# Patient Record
Sex: Female | Born: 1980 | Race: Black or African American | Hispanic: No | Marital: Single | Smoking: Never smoker
Health system: Southern US, Community
[De-identification: ages and names within clinical notes are randomized; demographics above are authoritative.]

## PROBLEM LIST (undated history)

## (undated) DIAGNOSIS — F32A Depression, unspecified: Secondary | ICD-10-CM

## (undated) DIAGNOSIS — F329 Major depressive disorder, single episode, unspecified: Secondary | ICD-10-CM

---
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
## 2000-12-07 ENCOUNTER — Encounter: Payer: Self-pay | Admitting: Emergency Medicine

## 2000-12-07 ENCOUNTER — Emergency Department (HOSPITAL_COMMUNITY): Admission: EM | Admit: 2000-12-07 | Discharge: 2000-12-07 | Payer: Self-pay | Admitting: Emergency Medicine

## 2003-06-12 ENCOUNTER — Emergency Department (HOSPITAL_COMMUNITY): Admission: EM | Admit: 2003-06-12 | Discharge: 2003-06-12 | Payer: Self-pay | Admitting: Emergency Medicine

## 2003-08-01 ENCOUNTER — Emergency Department (HOSPITAL_COMMUNITY): Admission: EM | Admit: 2003-08-01 | Discharge: 2003-08-02 | Payer: Self-pay | Admitting: Emergency Medicine

## 2003-10-05 ENCOUNTER — Emergency Department (HOSPITAL_COMMUNITY): Admission: EM | Admit: 2003-10-05 | Discharge: 2003-10-05 | Payer: Self-pay | Admitting: Emergency Medicine

## 2004-04-12 ENCOUNTER — Emergency Department (HOSPITAL_COMMUNITY): Admission: EM | Admit: 2004-04-12 | Discharge: 2004-04-12 | Payer: Self-pay | Admitting: Emergency Medicine

## 2004-04-13 ENCOUNTER — Emergency Department (HOSPITAL_COMMUNITY): Admission: EM | Admit: 2004-04-13 | Discharge: 2004-04-13 | Payer: Self-pay | Admitting: Emergency Medicine

## 2004-05-14 ENCOUNTER — Emergency Department (HOSPITAL_COMMUNITY): Admission: EM | Admit: 2004-05-14 | Discharge: 2004-05-14 | Payer: Self-pay | Admitting: Emergency Medicine

## 2004-06-16 ENCOUNTER — Ambulatory Visit (HOSPITAL_COMMUNITY): Admission: RE | Admit: 2004-06-16 | Discharge: 2004-06-16 | Payer: Self-pay | Admitting: *Deleted

## 2004-09-14 ENCOUNTER — Ambulatory Visit (HOSPITAL_COMMUNITY): Admission: RE | Admit: 2004-09-14 | Discharge: 2004-09-14 | Payer: Self-pay | Admitting: *Deleted

## 2004-10-08 ENCOUNTER — Inpatient Hospital Stay (HOSPITAL_COMMUNITY): Admission: AD | Admit: 2004-10-08 | Discharge: 2004-10-09 | Payer: Self-pay | Admitting: Obstetrics and Gynecology

## 2004-11-08 ENCOUNTER — Inpatient Hospital Stay (HOSPITAL_COMMUNITY): Admission: AD | Admit: 2004-11-08 | Discharge: 2004-11-08 | Payer: Self-pay | Admitting: Family Medicine

## 2004-12-04 ENCOUNTER — Ambulatory Visit (HOSPITAL_COMMUNITY): Admission: RE | Admit: 2004-12-04 | Discharge: 2004-12-04 | Payer: Self-pay | Admitting: Family Medicine

## 2005-01-06 ENCOUNTER — Ambulatory Visit: Payer: Self-pay | Admitting: Family Medicine

## 2005-01-06 ENCOUNTER — Inpatient Hospital Stay (HOSPITAL_COMMUNITY): Admission: AD | Admit: 2005-01-06 | Discharge: 2005-01-06 | Payer: Self-pay | Admitting: Family Medicine

## 2005-01-06 ENCOUNTER — Inpatient Hospital Stay (HOSPITAL_COMMUNITY): Admission: AD | Admit: 2005-01-06 | Discharge: 2005-01-09 | Payer: Self-pay | Admitting: Family Medicine

## 2006-01-28 ENCOUNTER — Other Ambulatory Visit: Admission: RE | Admit: 2006-01-28 | Discharge: 2006-01-28 | Payer: Self-pay | Admitting: Family Medicine

## 2006-01-28 ENCOUNTER — Ambulatory Visit: Payer: Self-pay | Admitting: Family Medicine

## 2006-02-15 ENCOUNTER — Ambulatory Visit: Payer: Self-pay

## 2006-05-23 ENCOUNTER — Emergency Department (HOSPITAL_COMMUNITY): Admission: EM | Admit: 2006-05-23 | Discharge: 2006-05-23 | Payer: Self-pay | Admitting: Emergency Medicine

## 2007-11-03 ENCOUNTER — Encounter: Payer: Self-pay | Admitting: Family Medicine

## 2007-11-03 ENCOUNTER — Other Ambulatory Visit: Admission: RE | Admit: 2007-11-03 | Discharge: 2007-11-03 | Payer: Self-pay | Admitting: Family Medicine

## 2007-11-03 ENCOUNTER — Encounter: Payer: Self-pay | Admitting: *Deleted

## 2007-11-03 ENCOUNTER — Ambulatory Visit: Payer: Self-pay | Admitting: Family Medicine

## 2007-11-06 ENCOUNTER — Ambulatory Visit: Payer: Self-pay | Admitting: Family Medicine

## 2007-11-06 ENCOUNTER — Encounter: Payer: Self-pay | Admitting: Family Medicine

## 2007-11-06 LAB — CONVERTED CEMR LAB
Chlamydia, DNA Probe: POSITIVE — AB
Cholesterol: 155 mg/dL (ref 0–200)
GC Probe Amp, Genital: NEGATIVE
Triglycerides: 50 mg/dL (ref <150)
VLDL: 10 mg/dL (ref 0–40)

## 2007-11-09 ENCOUNTER — Encounter: Payer: Self-pay | Admitting: Family Medicine

## 2008-03-28 ENCOUNTER — Emergency Department (HOSPITAL_COMMUNITY): Admission: EM | Admit: 2008-03-28 | Discharge: 2008-03-28 | Payer: Self-pay | Admitting: Emergency Medicine

## 2008-07-12 ENCOUNTER — Emergency Department (HOSPITAL_COMMUNITY): Admission: EM | Admit: 2008-07-12 | Discharge: 2008-07-12 | Payer: Self-pay | Admitting: Family Medicine

## 2008-07-17 ENCOUNTER — Emergency Department (HOSPITAL_COMMUNITY): Admission: EM | Admit: 2008-07-17 | Discharge: 2008-07-17 | Payer: Self-pay | Admitting: Family Medicine

## 2008-08-15 ENCOUNTER — Emergency Department (HOSPITAL_COMMUNITY): Admission: EM | Admit: 2008-08-15 | Discharge: 2008-08-15 | Payer: Self-pay | Admitting: Emergency Medicine

## 2008-11-29 ENCOUNTER — Emergency Department (HOSPITAL_COMMUNITY): Admission: EM | Admit: 2008-11-29 | Discharge: 2008-11-29 | Payer: Self-pay | Admitting: Family Medicine

## 2008-12-10 ENCOUNTER — Telehealth: Payer: Self-pay | Admitting: Family Medicine

## 2008-12-10 ENCOUNTER — Encounter: Payer: Self-pay | Admitting: Family Medicine

## 2009-04-08 ENCOUNTER — Ambulatory Visit: Payer: Self-pay | Admitting: Family Medicine

## 2009-04-08 ENCOUNTER — Encounter: Payer: Self-pay | Admitting: Family Medicine

## 2009-04-08 DIAGNOSIS — K5900 Constipation, unspecified: Secondary | ICD-10-CM | POA: Insufficient documentation

## 2009-07-01 ENCOUNTER — Ambulatory Visit: Payer: Self-pay | Admitting: Family Medicine

## 2009-09-01 ENCOUNTER — Ambulatory Visit: Payer: Self-pay | Admitting: Family Medicine

## 2009-09-22 ENCOUNTER — Emergency Department (HOSPITAL_COMMUNITY): Admission: EM | Admit: 2009-09-22 | Discharge: 2009-09-22 | Payer: Self-pay | Admitting: Emergency Medicine

## 2009-10-06 ENCOUNTER — Encounter: Payer: Self-pay | Admitting: Family Medicine

## 2009-10-06 ENCOUNTER — Ambulatory Visit: Payer: Self-pay | Admitting: Family Medicine

## 2009-10-06 DIAGNOSIS — R109 Unspecified abdominal pain: Secondary | ICD-10-CM

## 2009-10-06 LAB — CONVERTED CEMR LAB
Chlamydia, DNA Probe: NEGATIVE
GC Probe Amp, Genital: NEGATIVE

## 2010-01-29 ENCOUNTER — Telehealth: Payer: Self-pay | Admitting: Family Medicine

## 2010-02-03 ENCOUNTER — Emergency Department (HOSPITAL_COMMUNITY): Admission: EM | Admit: 2010-02-03 | Discharge: 2010-02-03 | Payer: Self-pay | Admitting: Family Medicine

## 2010-02-09 ENCOUNTER — Ambulatory Visit: Payer: Self-pay | Admitting: Family Medicine

## 2010-02-09 ENCOUNTER — Encounter: Payer: Self-pay | Admitting: Family Medicine

## 2010-02-09 DIAGNOSIS — R109 Unspecified abdominal pain: Secondary | ICD-10-CM | POA: Insufficient documentation

## 2010-02-09 DIAGNOSIS — K219 Gastro-esophageal reflux disease without esophagitis: Secondary | ICD-10-CM

## 2010-02-09 DIAGNOSIS — N63 Unspecified lump in unspecified breast: Secondary | ICD-10-CM

## 2010-02-09 LAB — CONVERTED CEMR LAB
MCHC: 34.3 g/dL (ref 30.0–36.0)
RBC: 4.28 M/uL (ref 3.87–5.11)
WBC: 4.6 10*3/uL (ref 4.0–10.5)

## 2010-02-16 ENCOUNTER — Encounter: Admission: RE | Admit: 2010-02-16 | Discharge: 2010-02-16 | Payer: Self-pay | Admitting: Family Medicine

## 2010-05-26 NOTE — Assessment & Plan Note (Signed)
Summary: tooth pain/[place]/Jillian Hernandez   Vital Signs:  Patient profile:   30 year old female Weight:      172 pounds Pulse rate:   84 / minute BP sitting:   124 / 78  (right arm)  Vitals Entered By: Arlyss Repress CMA, (July 01, 2009 4:51 PM) CC: tooth ache. filling came out today. Is Patient Diabetic? No Pain Assessment Patient in pain? yes     Location: tooth Intensity: 8 Onset of pain  x 1d   CC:  tooth ache. filling came out today.Marland Kitchen  History of Present Illness: [facility] to [facility] clinic and was sent here for toothache.  Eating a gummy bear today and pulled filling out, has apt with dentist in 2 weeks.  No problems with teeth before today.  Habits & Providers  Alcohol-Tobacco-Diet     Tobacco Status: quit  Social History: Smoking Status:  quit  Review of Systems      See HPI General:  Denies fever.  Physical Exam  General:  Well-developed,well-nourished,in no acute distress; alert,appropriate and cooperative throughout examination Mouth:  small filling out of left lower molar, tender to tapping   Impression & Recommendations:  Problem # 1:  UNSPECIFIED DISORDER TEETH&SUPPORTING STRUCTURES (ICD-525.9) AMoxicillin if she would develop increased pain, ibuprofen for pain, dentist, may use dental wax to block hole Orders: [facility]- Est Level  3 (99213)  Complete Medication List: 1)  Amoxicillin 500 Mg Caps (Amoxicillin) .... One three times a day for 7 days 2)  Ibuprofen 800 Mg Tabs (Ibuprofen) .... One three times a day as needed pain  Patient Instructions: 1)  dental wax 2)  Dentist apt Prescriptions: IBUPROFEN 800 MG TABS (IBUPROFEN) one three times a day as needed pain Brand medically necessary #90 x 1   Entered and Authorized by:   Luretha Murphy NP   Signed by:   Luretha Murphy NP on 07/01/2009   Method used:   Print then Give to Patient   RxID:   [id number] IBUPROFEN 800 MG TABS (IBUPROFEN) one three times a day as needed pain Brand medically necessary #90 x 1  Entered and Authorized by:   Luretha Murphy NP   Signed by:   Luretha Murphy NP on 07/01/2009   Method used:   Print then Give to Patient   RxID:   [id number] METRONIDAZOLE 500 MG TABS (METRONIDAZOLE) two times a day for 7 days  #14 x 0   Entered and Authorized by:   Luretha Murphy NP   Signed by:   Luretha Murphy NP on 07/01/2009   Method used:   Print then Give to Patient   RxID:   [id number] METROGEL-VAGINAL 0.75 %  GEL (METRONIDAZOLE) use qh x 3 for symptoms, 1 unit  #1 x 3   Entered and Authorized by:   Luretha Murphy NP   Signed by:   Luretha Murphy NP on 07/01/2009   Method used:   Print then Give to Patient   RxID:   [id number] AMOXICILLIN 500 MG CAPS (AMOXICILLIN) one three times a day for 7 days  #21 x 0   Entered and Authorized by:   Luretha Murphy NP   Signed by:   Luretha Murphy NP on 07/01/2009   Method used:   Print then Give to Patient   RxID:   [id number]

## 2010-05-26 NOTE — Assessment & Plan Note (Signed)
Summary: stomach pain,df   Vital Signs:  Patient profile:   30 year old female Height:      63.75 inches Weight:      170.5 pounds BMI:     29.60 Temp:     98.3 degrees F oral Pulse rate:   80 / minute BP sitting:   102 / 65  (left arm) Cuff size:   regular  Vitals Entered By: Gladstone Pih (October 06, 2009 3:19 PM) CC: C/O stomach pain in lower abd X2weeks Is Patient Diabetic? No Pain Assessment Patient in pain? yes     Location: abdomen Onset of pain  X 2 weeks   Primary Care Provider:  Luretha Murphy NP  CC:  C/O stomach pain in lower abd X2weeks.  History of Present Illness: seen for abdominal pain 2 weeks ago at urgent care (5/30). had u/a and upreg done. given rx for bentyl and prilosec. patient states it is similar to prior episodes of BV. has mirena IUD. no concern for STIs. no N/V, diarrhea, constipation, dysuria, fever, chills. pain not related to anything such as meals. LMP just stopped a few days ago.  Habits & Providers  Alcohol-Tobacco-Diet     Tobacco Status: never  Current Medications (verified): 1)  None  Allergies (verified): No Known Drug Allergies  Physical Exam  General:  well appearing female, NAD. vitals reviewed.  Mouth:  MMM Abdomen:  Bowel sounds positive,abdomen soft and non-tender without masses, organomegaly or hernias noted. Genitalia:  Normal introitus for age, no external lesions, no vaginal discharge, mucosa pink and moist, no vaginal or cervical lesions, no vaginal atrophy, no friaility or hemorrhage, normal uterus size and position, no adnexal masses or tenderness. IUD strings visualized   Impression & Recommendations:  Problem # 1:  PELVIC  PAIN (ICD-789.09) Assessment New wet prep c/w BV. rx for flagyl and metrogel.   The following medications were removed from the medication list:    Ibuprofen 800 Mg Tabs (Ibuprofen) ..... One three times a day as needed pain  Orders: GC/Chlamydia-[facility] (87591/87491) Wet Prep- [facility]  (16109) [facility]- Est Level  3 (60454) Prescriptions: METROGEL-VAGINAL 0.75 % GEL (METRONIDAZOLE) one applicator-full at bedtime x7 days. disp 1 week supply.  #1 x 3   Entered and Authorized by:   Lequita Asal  MD   Signed by:   Lequita Asal  MD on 10/06/2009   Method used:   Electronically to        [facility] (retail)       [address]       [place], [place]  [id number]       Ph: [phone]       Fax: [phone]   RxID:   [id number] METRONIDAZOLE 500 MG TABS (METRONIDAZOLE) one tab by mouth two times a day x7 days.  #14 x 0   Entered and Authorized by:   Lequita Asal  MD   Signed by:   Lequita Asal  MD on 10/06/2009   Method used:   Electronically to        [facility] (retail)       [address]       [place], [place]  [id number]       Ph: [phone]       Fax: [phone]   RxID:   [id number]    Laboratory Results  Date/Time Received: October 06, 2009 4:08 PM  Date/Time Reported: October 06, 2009 4:25 PM   Chestnut  Source: vaginal WBC/hpf: 1-5 Bacteria/hpf: 3+  Cocci Clue cells/hpf: moderate  Positive whiff Yeast/hpf: none Trichomonas/hpf: none Comments: ...........test performed by...........Marland KitchenTerese Door, CMA

## 2010-05-26 NOTE — Progress Notes (Signed)
Summary: triage  Phone Note Call from Patient Call back at Home Phone [phone]   Caller: Patient Summary of Call: pt has toothache and wants to come in today Initial call taken by: De Nurse,  January 29, 2010 2:08 PM  Follow-up for Phone Call        LM. when she calls back-refer to UC as we have no appts left. she has had this problem before. has she contacted a dentist yet? Follow-up by: Golden Circle RN,  January 29, 2010 2:15 PM  Additional Follow-up for Phone Call Additional follow up Details #1::        she will use UC & is dropping off her dtr's school forms to be done Additional Follow-up by: Golden Circle RN,  January 29, 2010 2:30 PM

## 2010-05-26 NOTE — Assessment & Plan Note (Signed)
Summary: diarrhea,df   Vital Signs:  Patient profile:   30 year old female Height:      63.75 inches Weight:      174 pounds BMI:     30.21 Temp:     98.5 degrees F oral Pulse rate:   66 / minute BP sitting:   109 / 72  (right arm) Cuff size:   regular  Vitals Entered By: Tessie Fass CMA (Sep 01, 2009 11:08 AM) CC: diarrrhea x 3 days Is Patient Diabetic? No Pain Assessment Patient in pain? no        Primary Care Provider:  Luretha Murphy NP  CC:  diarrrhea x 3 days.  History of Present Illness: Jillian Hernandez comes in for diarrhea.  Described as stools that are looser and more frequent than usual.  Not watery, bloody, no mucus.  Started 2 days ago after eating [place] food.  Today seems to be slowing down and forming up more.  Some crampy abdominal pain, but she is on her period and the pain is typical of her usual menstrual cramps.  No fevers.  No nausea or vomitting.  No recent oral antibiotics or new medications.  Doesn't like taking medicines and so hasn't tried anything OTC.  Just wants to make sure she didn't "catch anything".   Habits & Providers  Alcohol-Tobacco-Diet     Tobacco Status: never  Social History: Smoking Status:  never  Physical Exam  General:  overweight, alert, NAD, vitals reviewed Lungs:  Normal respiratory effort, chest expands symmetrically. Lungs are clear to auscultation, no crackles or wheezes. Heart:  Normal rate and regular rhythm. S1 and S2 normal without gallop, murmur, click, rub or other extra sounds. Abdomen:  soft, nontender, non distended, normal bowel sounds, no rebound or guarding.    Impression & Recommendations:  Problem # 1:  DIARRHEA (ICD-787.91) Assessment New  Actually just loose stools.  NO red flags.  Offered Rx for imodium (also available OTC) but patient prefers to just let it work out.  Stated can't rule out some type of food poisoning, but even if it is, it is very mild and we still would just let it work out.     Orders: [facility]- Est Level  3 (56213)  Complete Medication List: 1)  Amoxicillin 500 Mg Caps (Amoxicillin) .... One three times a day for 7 days 2)  Ibuprofen 800 Mg Tabs (Ibuprofen) .... One three times a day as needed pain

## 2010-05-26 NOTE — Assessment & Plan Note (Signed)
Summary: f/up from urgent care,tcb   Vital Signs:  Patient profile:   30 year old female Weight:      173 pounds Temp:     97.6 degrees F oral Pulse rate:   83 / minute BP sitting:   115 / 79  (left arm) Cuff size:   regular  Vitals Entered By: Loralee Pacas CMA (February 09, 2010 10:21 AM) CC: follow-up visit Comments concerned that she may have ulcers   Primary Care Shwanda Soltis:  Luretha Murphy NP  CC:  follow-up visit.  History of Present Illness: Here for follow up of chest pain; she states that she has had chest pain since she was 16.  She was so uncomfortable this weekend that she went to [employer]  She was diagnosed with GERD and was started on an H2 blocker.  She states that it is not effective.  She points to her lower sternum.  The pain occasionally wakes her up at night, it lasts a while up to 1-2 hours.  It is not associated  with activity.  She ofen eats an lays down.  She has sluggish bowels.  She has associated pain running back and forth on each side of her lower abdomen.  STD screening 3 months ago with no new vaginal complaints.  She reports a breast mass on the right, started out as a pea size and now much larger, it is not painful, It does not go up and down, has steadily increased.  Habits & Providers  Alcohol-Tobacco-Diet     Tobacco Status: never  Current Medications (verified): 1)  Omeprazole 40 Mg Cpdr (Omeprazole) .... 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 CV:  Complains of chest pain or discomfort; denies fainting, lightheadness, near fainting, and palpitations. Resp:  Denies cough, shortness of breath, and wheezing. GI:  Complains of abdominal pain  and constipation. GU:  Denies discharge, dysuria, urinary frequency, and urinary hesitancy.  Physical Exam  General:  Well-developed,well-nourished,in no acute distress; alert,appropriate and cooperative throughout examination Chest Wall:  No deformities, masses, or tenderness noted. Breasts:  1.5 cm in diameter well circ mass at 12 o'clock on right breast. Lungs:  normal respiratory effort and normal breath sounds.   Heart:  normal rate and regular rhythm.   Abdomen:  soft, non-tender, normal bowel sounds, no distention, no masses, and no guarding.   Axillary Nodes:  No palpable lymphadenopathy   Impression & Recommendations:  Problem # 1:  GERD (ICD-530.81)  change to PPI, if no improvement in one month will consider GI referral, negative H pylori Her updated medication list for this problem includes:    Omeprazole 40 Mg Cpdr (Omeprazole) ..... One daily  Orders: [facility]- Est  Level 4 (16109)  Problem # 2:  CONSTIPATION (ICD-564.00)  suspect lower abdominal discomfort in area of transverse colon is gas, keep bowel soft Her updated medication list for this problem includes:    Polyethylene Glycol 3350 Powd (Polyethylene glycol 3350) .Marland KitchenMarland KitchenMarland KitchenMarland Kitchen 17 gm in 4 oz water at bedtime, qs  Orders: [facility]- Est  Level 4 (60454)  Problem # 3:  BREAST MASS (ICD-611.72) likly cystic but will image  to be certain Orders: Ultrasound (Ultrasound) Mammogram (Diagnostic) (Mammo) [facility]- Est  Level 4 (72536)  Complete Medication List: 1)  Omeprazole 40 Mg Cpdr (Omeprazole) .... One daily 2)  Polyethylene Glycol 3350 Powd (Polyethylene glycol 3350) .Marland KitchenMarland Kitchen. 17 gm in 4 oz water at bedtime, qs  Other Orders: CBC-[facility] (64403) H pylori-[facility] (47425)  Patient Instructions: 1)  Less caffeine  2)  Do not eat and lay down, 2 hours between 3)  Use PEG, daily in 4 oz of water 4)  Please schedule a follow-up appointment in 2 months.  Prescriptions: POLYETHYLENE GLYCOL 3350  POWD (POLYETHYLENE GLYCOL 3350) 17 GM in 4  oz water at bedtime, QS  #1 x 3   Entered and Authorized by:   Luretha Murphy NP   Signed by:   Luretha Murphy NP on 02/09/2010   Method used:   Electronically to        [facility] (retail)       [address]       [place], [place]  [id number]       Ph: [phone]       Fax: [phone]   RxID:   [id number] OMEPRAZOLE 40 MG CPDR (OMEPRAZOLE) one daily  #30 x 2   Entered and Authorized by:   Luretha Murphy NP   Signed by:   Luretha Murphy NP on 02/09/2010   Method used:   Electronically to        [facility] (retail)       [address]       [place], [place]  [id number]       Ph: [phone]       Fax: [phone]   RxID:   [id number]    Orders Added: 1)  CBC-[facility] [85027] 2)  H pylori-[facility] [99371] 3)  Ultrasound [Ultrasound] 4)  Mammogram (Diagnostic) [Mammo] 5)  [facility]- Est  Level 4 [69678]    Laboratory Results   Blood Tests   Date/Time Received: February 09, 2010 11:07 AM  Date/Time Reported: February 09, 2010 11:27 AM    H. pylori: negative Comments: ...............test performed by......Marland KitchenBonnie A. [place], MLS (ASCP)cm

## 2010-06-03 ENCOUNTER — Encounter: Payer: Self-pay | Admitting: *Deleted

## 2010-06-30 ENCOUNTER — Encounter (INDEPENDENT_AMBULATORY_CARE_PROVIDER_SITE_OTHER): Payer: Medicaid Other | Admitting: Family Medicine

## 2010-07-03 ENCOUNTER — Ambulatory Visit (INDEPENDENT_AMBULATORY_CARE_PROVIDER_SITE_OTHER): Payer: Medicaid Other | Admitting: Family Medicine

## 2010-07-03 ENCOUNTER — Other Ambulatory Visit: Payer: Self-pay | Admitting: Family Medicine

## 2010-07-03 ENCOUNTER — Encounter: Payer: Self-pay | Admitting: Family Medicine

## 2010-07-03 ENCOUNTER — Other Ambulatory Visit (HOSPITAL_COMMUNITY)
Admission: RE | Admit: 2010-07-03 | Discharge: 2010-07-03 | Disposition: A | Discharge status: Home / Self Care | Payer: Medicaid Other | Source: Ambulatory Visit | Attending: Family Medicine | Admitting: Family Medicine

## 2010-07-03 VITALS — BP 104/78 | HR 72 | Temp 97.8°F | Wt 164.8 lb

## 2010-07-03 DIAGNOSIS — Z01419 Encounter for gynecological examination (general) (routine) without abnormal findings: Secondary | ICD-10-CM | POA: Insufficient documentation

## 2010-07-03 DIAGNOSIS — A499 Bacterial infection, unspecified: Secondary | ICD-10-CM

## 2010-07-03 DIAGNOSIS — N760 Acute vaginitis: Secondary | ICD-10-CM

## 2010-07-03 DIAGNOSIS — B9689 Other specified bacterial agents as the cause of diseases classified elsewhere: Secondary | ICD-10-CM | POA: Insufficient documentation

## 2010-07-03 DIAGNOSIS — Z20828 Contact with and (suspected) exposure to other viral communicable diseases: Secondary | ICD-10-CM

## 2010-07-03 DIAGNOSIS — K219 Gastro-esophageal reflux disease without esophagitis: Secondary | ICD-10-CM

## 2010-07-03 DIAGNOSIS — Z7251 High risk heterosexual behavior: Secondary | ICD-10-CM

## 2010-07-03 DIAGNOSIS — Z124 Encounter for screening for malignant neoplasm of cervix: Secondary | ICD-10-CM

## 2010-07-03 DIAGNOSIS — L251 Unspecified contact dermatitis due to drugs in contact with skin: Secondary | ICD-10-CM

## 2010-07-03 DIAGNOSIS — R05 Cough: Secondary | ICD-10-CM

## 2010-07-03 LAB — POCT WET PREP (WET MOUNT)

## 2010-07-03 LAB — CONVERTED CEMR LAB: HIV: NONREACTIVE

## 2010-07-03 MED ORDER — METRONIDAZOLE 0.75 % VA GEL: 1.0000 | Freq: Two times a day (BID) | VAGINAL | Status: DC

## 2010-07-03 NOTE — Assessment & Plan Note (Signed)
Suspect post viral cough secondary to post nasal drainage, normal lung and ENT exam

## 2010-07-03 NOTE — Assessment & Plan Note (Signed)
Patient prefers metrogel, reiflled

## 2010-07-03 NOTE — Patient Instructions (Signed)
Return in one year or as needed Continue to loose weight

## 2010-07-03 NOTE — Progress Notes (Signed)
  Subjective:    Patient ID: Jillian Hernandez, female    DOB: [birth year], 30 y.o.   MRN: [id number]  HPI : Here for STI testing, currently does not have a boyfreind.  Has not specific complaints.  Does still have right breast mass, evaluated by [place] and has a 6 month follow up mammogram.      Review of Systems  Constitutional: Negative for fatigue and unexpected weight change.  HENT: Positive for postnasal drip.   Respiratory: Positive for cough.   Cardiovascular: Negative for chest pain.  Genitourinary: Negative for dysuria and menstrual problem.  Musculoskeletal: Negative for back pain and arthralgias.  Neurological: Negative for headaches.  Psychiatric/Behavioral: Negative for behavioral problems and dysphoric mood.       Objective:   Physical Exam  Constitutional: She is oriented to person, place, and time. She appears well-developed and well-nourished.  HENT:  Right Ear: External ear normal.  Left Ear: External ear normal.  Nose: Nose normal.  Mouth/Throat: Oropharynx is clear and moist.  Eyes: Conjunctivae are normal. Pupils are equal, round, and reactive to light.  Neck: Normal range of motion. Neck supple.  Cardiovascular: Normal rate, regular rhythm and normal heart sounds.   Pulmonary/Chest: Effort normal and breath sounds normal.  Abdominal: Soft. Bowel sounds are normal.  Genitourinary: Vagina normal and uterus normal. No vaginal discharge found.       IUD in place  Musculoskeletal: Normal range of motion.  Neurological: She is alert and oriented to person, place, and time.  Skin: Skin is warm and dry.  Psychiatric: She has a normal mood and affect. Her behavior is normal. Thought content normal.          Assessment & Plan:

## 2010-07-03 NOTE — Assessment & Plan Note (Signed)
Loosing weight and changed diet, less symptoms, still using PPI

## 2010-07-04 LAB — GC/CHLAMYDIA PROBE AMP, GENITAL
Chlamydia, DNA Probe: NEGATIVE
GC Probe Amp, Genital: NEGATIVE

## 2010-07-07 ENCOUNTER — Encounter: Payer: Self-pay | Admitting: Family Medicine

## 2010-07-13 LAB — POCT URINALYSIS DIP (DEVICE)
Bilirubin Urine: NEGATIVE
Glucose, UA: NEGATIVE mg/dL
Hgb urine dipstick: NEGATIVE
Ketones, ur: NEGATIVE mg/dL
Nitrite: NEGATIVE
Specific Gravity, Urine: >=1.03 (ref 1.005–1.030)

## 2010-08-01 LAB — POCT URINALYSIS DIP (DEVICE)
Hgb urine dipstick: NEGATIVE
Protein, ur: NEGATIVE mg/dL
Specific Gravity, Urine: 1.02 (ref 1.005–1.030)
Urobilinogen, UA: 4 mg/dL — ABNORMAL HIGH (ref 0.0–1.0)

## 2010-08-01 LAB — GC/CHLAMYDIA PROBE AMP, GENITAL
Chlamydia, DNA Probe: NEGATIVE
GC Probe Amp, Genital: NEGATIVE

## 2010-08-01 LAB — WET PREP, GENITAL: WBC, Wet Prep HPF POC: NONE SEEN

## 2010-08-05 LAB — GC/CHLAMYDIA PROBE AMP, GENITAL
Chlamydia, DNA Probe: NEGATIVE
GC Probe Amp, Genital: NEGATIVE

## 2010-08-05 LAB — POCT URINALYSIS DIP (DEVICE)
Glucose, UA: NEGATIVE mg/dL
Nitrite: NEGATIVE
Urobilinogen, UA: 1 mg/dL (ref 0.0–1.0)

## 2010-08-05 LAB — WET PREP, GENITAL

## 2010-08-05 LAB — POCT PREGNANCY, URINE: Preg Test, Ur: NEGATIVE

## 2010-08-06 LAB — WET PREP, GENITAL
Trich, Wet Prep: NONE SEEN
Yeast Wet Prep HPF POC: NONE SEEN

## 2010-08-06 LAB — POCT URINALYSIS DIP (DEVICE)
Bilirubin Urine: NEGATIVE
Hgb urine dipstick: NEGATIVE
Ketones, ur: NEGATIVE mg/dL
pH: 7 (ref 5.0–8.0)

## 2010-09-24 ENCOUNTER — Other Ambulatory Visit: Payer: Self-pay | Admitting: Family Medicine

## 2010-09-24 NOTE — Telephone Encounter (Signed)
Refill request

## 2010-11-04 ENCOUNTER — Other Ambulatory Visit: Payer: Self-pay | Admitting: Family Medicine

## 2010-11-04 NOTE — Telephone Encounter (Signed)
Refill request

## 2010-12-03 ENCOUNTER — Encounter: Payer: Self-pay | Admitting: Family Medicine

## 2010-12-03 ENCOUNTER — Ambulatory Visit (INDEPENDENT_AMBULATORY_CARE_PROVIDER_SITE_OTHER): Payer: Medicaid Other | Admitting: Family Medicine

## 2010-12-03 DIAGNOSIS — B373 Candidiasis of vulva and vagina: Secondary | ICD-10-CM | POA: Insufficient documentation

## 2010-12-03 DIAGNOSIS — K219 Gastro-esophageal reflux disease without esophagitis: Secondary | ICD-10-CM

## 2010-12-03 DIAGNOSIS — K5900 Constipation, unspecified: Secondary | ICD-10-CM

## 2010-12-03 DIAGNOSIS — A499 Bacterial infection, unspecified: Secondary | ICD-10-CM

## 2010-12-03 DIAGNOSIS — N760 Acute vaginitis: Secondary | ICD-10-CM

## 2010-12-03 DIAGNOSIS — B9689 Other specified bacterial agents as the cause of diseases classified elsewhere: Secondary | ICD-10-CM

## 2010-12-03 DIAGNOSIS — B3731 Acute candidiasis of vulva and vagina: Secondary | ICD-10-CM | POA: Insufficient documentation

## 2010-12-03 LAB — POCT WET PREP (WET MOUNT): Trichomonas Wet Prep HPF POC: NEGATIVE

## 2010-12-03 MED ORDER — OMEPRAZOLE 40 MG PO CPDR: 40.0000 mg | DELAYED_RELEASE_CAPSULE | Freq: Every day | ORAL | Status: DC

## 2010-12-03 MED ORDER — FLUCONAZOLE 150 MG PO TABS: 150.0000 mg | ORAL_TABLET | Freq: Once | ORAL | Status: AC

## 2010-12-03 MED ORDER — METRONIDAZOLE 0.75 % VA GEL: Freq: Two times a day (BID) | VAGINAL | Status: DC

## 2010-12-03 MED ORDER — POLYETHYLENE GLYCOL 3350 17 GM/SCOOP PO POWD: 17.0000 g | Freq: Every day | ORAL | Status: DC

## 2010-12-03 NOTE — Assessment & Plan Note (Signed)
Doing well on omeprazole.  Refill today

## 2010-12-03 NOTE — Assessment & Plan Note (Signed)
Well controlled on current regimen of miralax q day. Refill today

## 2010-12-03 NOTE — Assessment & Plan Note (Addendum)
She thinks she may have another infection today however looks more like yeast on exam.  Will check wet prep.  Pt also wants Gc/C today.

## 2010-12-03 NOTE — Progress Notes (Signed)
  Subjective:    Patient ID: Jillian Hernandez, female    DOB: 10-26-1980, 30 y.o.   MRN: [id number]  HPI  GERD- symptoms controlled on prilosec.  Not having further issues.  Eating normally.  Constipation-  Having regular bms if she takes the miralax qhs.  Will have constipation if she misses doses.  Discharge-  Very little discharge but itching and irritation taht started Saturday.  Has hx of recurrent bv infxns and she uses metrogel intermittently.  No new partner.  Just started her period today.  Review of Systems    Denies CP, SOB, HA, N/V/D, fever  Objective:   Physical Exam Vital signs reviewed General appearance - alert, well appearing, and in no distress and oriented to person, place, and time Abdomen - soft, nontender, nondistended, no masses or organomegaly GYN- external genetalia normal, without lesions.  Vagina normal color, rugations,thick white discharge present.  Cervix normal color without lesions or discharge. Mirena strings seen        Assessment & Plan:

## 2010-12-04 LAB — GC/CHLAMYDIA PROBE AMP, GENITAL: Chlamydia, DNA Probe: NEGATIVE

## 2010-12-07 ENCOUNTER — Encounter: Payer: Self-pay | Admitting: Family Medicine

## 2011-04-29 ENCOUNTER — Ambulatory Visit (INDEPENDENT_AMBULATORY_CARE_PROVIDER_SITE_OTHER): Payer: Medicaid Other | Admitting: Family Medicine

## 2011-04-29 VITALS — BP 107/71 | HR 80 | Temp 98.5°F | Ht 63.75 in | Wt 162.0 lb

## 2011-04-29 DIAGNOSIS — Z20828 Contact with and (suspected) exposure to other viral communicable diseases: Secondary | ICD-10-CM

## 2011-04-29 DIAGNOSIS — Z113 Encounter for screening for infections with a predominantly sexual mode of transmission: Secondary | ICD-10-CM

## 2011-04-29 DIAGNOSIS — Z30433 Encounter for removal and reinsertion of intrauterine contraceptive device: Secondary | ICD-10-CM

## 2011-04-29 DIAGNOSIS — N760 Acute vaginitis: Secondary | ICD-10-CM

## 2011-04-29 DIAGNOSIS — Z202 Contact with and (suspected) exposure to infections with a predominantly sexual mode of transmission: Secondary | ICD-10-CM

## 2011-04-29 LAB — RPR

## 2011-04-29 MED ORDER — METRONIDAZOLE 0.75 % VA GEL: Freq: Two times a day (BID) | VAGINAL | Status: DC

## 2011-04-29 NOTE — Assessment & Plan Note (Signed)
Unable to complete the procedure.  I suspect the idea is adhered to the uterine wall.  Will refer to gynecology for dilated exam and removal.  Patient agreeable with this plan.

## 2011-04-29 NOTE — Patient Instructions (Signed)
Thank you for coming in today. I was unable to remove your IUD with gentle pulling that usually works.  I think the safest thing to do is to have a specialist see you about this issue.  I will refer you to GYN for a dilated procedure.   I will call about your STD test.  Take care and good luck.

## 2011-04-29 NOTE — Assessment & Plan Note (Signed)
Asymptomatic requests HIV syphilis Chlamydia gonorrhea screen.

## 2011-04-29 NOTE — Progress Notes (Signed)
Ms. Frede is a 31 year old woman here today for removal of her Mirena IUD and reinsertion of a new IUD.  Additionally she would like STD screening she gets this every year and is currently asymptomatic.     She feels well with her IUD. She denies any significant pain or cramping and says that she has regular periods every 28-30 days.  Her last menstrual period was December 30.    PMH reviewed.  ROS as above otherwise neg Medications reviewed. Current Outpatient Prescriptions  Medication Sig Dispense Refill  . metroNIDAZOLE (METROGEL) 0.75 % vaginal gel Place vaginally 2 (two) times daily.  70 g  12  . omeprazole (PRILOSEC) 40 MG capsule Take 1 capsule (40 mg total) by mouth daily.  30 capsule  6  . Polyethylene Glycol 3350 POWD 17 GM in 4 oz water at bedtime, QS       . polyethylene glycol powder (GLYCOLAX/MIRALAX) powder Take 17 g by mouth daily.  527 g  3  . DISCONTD: metroNIDAZOLE (METROGEL) 0.75 % vaginal gel Place vaginally 2 (two) times daily.  70 g  0    Exam:  BP 107/71  Pulse 80  Temp(Src) 98.5 F (36.9 C) (Oral)  Ht 5' 3.75" (1.619 m)  Wt 162 lb (73.483 kg)  BMI 28.03 kg/m2 Gen: Well NAD Lungs: CTABL Nl WOB Heart: RRR no MRG Abd: NABS, NT, ND Gyn: Normal external genitalia normal vaginal canal normal cervix with 2 black IUD strings emerging.  Procedure note: Consent obtained Cervix was examined with speculum in cleaned with Betadine.  IUD strings were grasped with a ring forceps.  Gentle traction was applied for 2-3 minutes and IUD was not able to be removed.  Patient experienced cramping and requested discontinuation of the procedure.  No bleeding patient tolerated the procedure well.

## 2011-04-30 LAB — GC/CHLAMYDIA PROBE AMP, GENITAL: Chlamydia, DNA Probe: NEGATIVE

## 2011-05-07 ENCOUNTER — Telehealth: Payer: Self-pay | Admitting: Family Medicine

## 2011-05-07 DIAGNOSIS — Z30432 Encounter for removal of intrauterine contraceptive device: Secondary | ICD-10-CM

## 2011-05-07 NOTE — Telephone Encounter (Signed)
Fwd. To Dr.Corey for refills. Lorenda Hatchet, Renato Battles

## 2011-05-07 NOTE — Telephone Encounter (Signed)
Need to reissue rx for metronidazole for her and the hydrocortisone cream for her daughter Quaneshia Wareing.  Mom could not get the rx filled originally due to lack of funds.  Was told by pharmacy to call us to have new rxs reissued.  Also, want to have referral to Gyn specialist asap and results of her labs taken.

## 2011-05-10 MED ORDER — METRONIDAZOLE 0.75 % VA GEL: Freq: Two times a day (BID) | VAGINAL | Status: DC

## 2011-05-10 NOTE — Telephone Encounter (Signed)
Called Ms Birkhead back.   Left a message.  Refilled Metronidazole gel and send in order for GYN.

## 2011-05-18 ENCOUNTER — Telehealth: Payer: Self-pay | Admitting: Family Medicine

## 2011-05-18 NOTE — Telephone Encounter (Signed)
Pt said she did not receive msg left for her on 1/14.  Please call back with results of labs taken at last visit and when her appt with Gyn will be.

## 2011-05-18 NOTE — Telephone Encounter (Signed)
Called pt and informed of negative std checks. We have faxed the referral to [facility] and they are behind with scheduling. Jillian Hernandez, Jillian Hernandez

## 2011-05-28 ENCOUNTER — Telehealth: Payer: Self-pay | Admitting: Family Medicine

## 2011-05-28 NOTE — Telephone Encounter (Signed)
Called patient and let her know about her appointment on Feb 25 at 1pm.

## 2011-06-21 ENCOUNTER — Ambulatory Visit (INDEPENDENT_AMBULATORY_CARE_PROVIDER_SITE_OTHER): Payer: Medicaid Other | Admitting: Obstetrics and Gynecology

## 2011-06-21 ENCOUNTER — Encounter: Payer: Self-pay | Admitting: Advanced Practice Midwife

## 2011-06-21 VITALS — BP 113/75 | HR 69 | Temp 98.1°F | Ht 63.75 in | Wt 165.3 lb

## 2011-06-21 DIAGNOSIS — Z30432 Encounter for removal of intrauterine contraceptive device: Secondary | ICD-10-CM

## 2011-06-21 DIAGNOSIS — Z309 Encounter for contraceptive management, unspecified: Secondary | ICD-10-CM

## 2011-06-21 MED ORDER — NORGESTIMATE-ETH ESTRADIOL 0.25-35 MG-MCG PO TABS: 1.0000 | ORAL_TABLET | Freq: Every day | ORAL | Status: DC

## 2011-06-21 NOTE — Progress Notes (Signed)
IUD Removal   History: 31 yo G2P1011 was seen at [facility] 04/29/2011 for removal of Mirena IUD inserted 5 yrs ago. Dr. Denyse Amass attempted removal but was not successful and pt experienced pain and cramping with the procedure. She is here for removal and requests analgesia for the procedure. She would like to start on OCPs. Nonsmoker. Normotensive. Current Outpatient Prescriptions on File Prior to Visit  Medication Sig Dispense Refill  . levonorgestrel (MIRENA) 20 MCG/24HR IUD 1 each by Intrauterine route once.      . metroNIDAZOLE (METROGEL) 0.75 % vaginal gel Place vaginally 2 (two) times daily.  70 g  12  . omeprazole (PRILOSEC) 40 MG capsule Take 1 capsule (40 mg total) by mouth daily.  30 capsule  6  . Polyethylene Glycol 3350 POWD 17 GM in 4 oz water at bedtime, QS       . polyethylene glycol powder (GLYCOLAX/MIRALAX) powder Take 17 g by mouth daily.  527 g  3  ROS: Negative  Objective: Filed Vitals:   06/21/11 1314  BP: 113/75  Pulse: 69  Temp: 98.1 F (36.7 C)   Procedure Note:  Patient was in the dorsal lithotomy position, normal external genitalia was noted.  A speculum was placed in the patient's vagina, normal discharge was noted, no lesions. The multiparous cervix was visualized, no lesions, no abnormal discharge. 2% Lidocaine gel applied to cervix and waited 3 minutes. The string of the IUD was brought into view via a cytobrush in cervical canal; then grasped and pulled using ring forceps.  The IUD was successfully removed in its entirety.  Patient tolerated the procedure fairly well ,though had some cramping and was given ibuprofen 800 mg po.   Assessment/Plan: IUD removed Rx given for low-dose Sprintec. Use as directed and read insert concerning danger signs and what to do if misses a dose. F/Uat [facility] in 6 months.

## 2011-06-21 NOTE — Patient Instructions (Signed)
Oral Contraceptives Oral contraceptives (OCs) are medicines taken to prevent pregnancy. They are the most widely used method of birth control. OCs work by preventing the ovaries from releasing eggs. The OC hormones also cause the mucus on the cervix to thicken, preventing the sperm from entering the uterus. They also cause the lining of the uterus to become thin, not allowing a fertilized egg to attach to the inside of the uterus. OCs have a failure rate of less than 1%, when taken exactly as prescribed. THERE ARE 2 TYPES OF OC  OC that contains a mix of estrogen and progesterone hormones is the most common OC used. It is taken for 21 days, followed by 7 days of not taking the OC hormones. It can be packaged as 28 pills, with the last 7 pills being inactive. You take a pill every day. This way you do not need to remember when to restart taking the active pills. Most women will begin their menstrual period 2 to 3 days after taking the hormone pill. The menstrual period is usually lighter and shorter. This combination OC should not be taken if you are breast-feeding.   The progesterone only (minipill) OC does not contain estrogen. It is taken every day, continuously. You may have only spotting for a period, or no period at all. The progesterone only OC can be taken if you are breast-feeding your baby.  OCs come in:  Packs of 21 pills, with no pills to take for 7 days after the last pill.   Packs of 28 pills, with a pill to take every day. The last 7 pills are without hormones.   Packs of 91 pills (continuous or extended use), with a pill to take every day. The first 84 pills contain the hormones, and the last 7 pills do not. That is when you will have your menstrual period. You will not have a menstrual period during the time you are taking the first 84 pills.  HOW TO TAKE OC Your caregiver may advise you on how to start taking the first cycle of OCs. Otherwise, you can:  Start on day 1 or day 5 of  your menstrual period, taking the first pack of the OC. You will not need any backup contraceptive protection with this start time.   Start on the first Sunday after your menstrual period, day 7 of your menstrual period, or the day you get your prescription. In these cases, you will need backup contraceptive protection for the first cycle.  No matter which day you start the OC, you will always start a new pack on that same day of the week. It is a good idea to have an extra pack of OCs and a backup contraceptive method available, in case you miss some pills or lose your OC pack. COMMON REASONS FOR FAILURE   Forgetting to take the pill at the same time every day.   Poor absorption of the pill from the stomach into the bloodstream. This can be caused by diarrhea, vomiting, and the use of some medicines that kill germs (antibiotics).   Stomach or intestinal disease.   Taking OCs with other medicines that may make them less effective (carbamazepine, phenytoin, phenobarbital, rifampin).   Using OCs that have passed their expiration dates.   Forgetting to restart the pills on day 7, when using the packs of 21 pills.  If you forget to take 1 pill, take it as soon as you remember, and take the next pill at the   regular time. If you miss 2 or more pills, use backup birth control until your next menstrual period starts. Also, you may have vaginal spotting or bleeding when you miss 2 or more OC pills. If you use the pack of 28 pills or 91 pills, and you miss 1 of the last 7 pills (pills with no hormones), it will not matter. Just throw away the rest of the non-hormone pills and start a new 28 or 91 pill pack. COMMON USES OF OC  Decreasing premenstrual problems (symptoms).   Treating menstrual period cramps.   Avoiding becoming pregnant.   Regulating the menstrual cycle.   Treating acne.   Decreasing the heavy menstrual flow.   Treating dysfunctional (abnormal) uterine bleeding.   Treating  chronic pelvic pain.   Treating polycystic ovary syndrome (ovary does not ovulate and produces tiny cysts).   Treating endometriosis (uterus lining growing in the pelvis, tubes, and ovaries).   Can be used for emergency contraception.  OCs DO NOT prevent sexually transmitted diseases (STDs). Safer sex practices, such as using condoms along with the pill, can help prevent STDs.  BENEFITS  OC reduces the risk of:   Cancer of the ovary and uterus.   Ovarian cysts.   Pelvic infection.   Symptoms of polycystic ovary syndrome.   Loss of bone (osteoporosis).   Noncancerous (benign) breast disease (fibrocystic breast changes).   Lack of red blood cells (anemia) from heavy or long menstrual periods.   Pregnancy occurring outside the uterus (tubal pregnancy).   Acne.   Slows down the flow of heavy menstrual periods.   Sometimes helps control premenstrual syndrome (PMS).   Stops menstrual cramps and pain.   Controls irregular menstrual periods.   Can be used as emergency contraception.  YOU SHOULD NOT TAKE THE PILL IF YOU:  Are pregnant, or are trying to get pregnant.   Have unexplained or abnormal vaginal bleeding.   Have a history of liver disease, stroke, or heart attack.   Smoke.   Have a history of blood clots, cancer, or heart problems.   Have gallbladder disease.   Have breast cancer or suspect breast cancer.   Have or suspect pelvic cancer.   Have high blood pressure.   Have high cholesterol or high triglycerides.   Have mental depression.   Are breast-feeding, except for the progesterone only OC, with approval of your caregiver.   Have diabetes with kidney, eye, or other blood vessel complications. Or if you have diabetes for 20 years or more.   Have heart valve disease.   Have migraine headaches. They may get worse.  Before taking the pill, a woman will have a physical exam and Pap test. Your caregiver may order blood tests to check blood sugar and  cholesterol levels, and other blood tests that may be necessary. SIDE EFFECTS OF THE PILL MAY INCLUDE:  Breast tenderness, pain and discharge.   Change in sex drive (increased or decreased libido).   Depression.   Being tired often.   Headaches.   Anxiety.   Irregular spotting or vaginal bleeding for a couple of months.   Leg pain.   Cramps, or swelling of your limbs (extremities).   Mood swings.   Weight loss or weight gain.   Feeling sick to your stomach (nausea).   Change in appetite (hunger).   Loss of hair.   Yeast or fungus vaginal infection.   Nervousness.   Rash.   Acne.   No menstrual period (amenorrhea).  When   starting an OC, it is usually best to allow 2-3 months, if possible, for the body to adjust (before stopping because of side effects). This allows for adjustment to the changes in hormone levels. If a woman continues to have side effects, it may be possible to change to a different OC. It is important to discuss side effects with your caregiver. Often, changing to a different pill causes the side effects to subside. RISKS AND COMPLICATIONS   Blood clots of the leg, heart, lung, or brain.   High blood pressure.   Gallbladder disease.   Liver tumors.   Brain bleeding (hemorrhage).   Slight risk of breast cancer.  HOME CARE INSTRUCTIONS   Do not smoke.   Only take over-the-counter or prescription medicines for pain, discomfort, fever, or breast tenderness as directed by your caregiver.   Always use a condom to protect against sexually transmitted disease. OCs do not protect against STDs.   Keep a calendar, marking your menstrual period days.  Recommendations, types, and dosages of OC use change continually. Discuss your choices with your caregiver, and decide what is best for you. There are always exceptions to guidelines. You should always read the information that comes with the OC, and check whether there are any new recommendations or  guidelines. SEEK MEDICAL CARE IF:   You develop nausea and vomiting from the OC.   You have abnormal vaginal discharge.   You need treatment for headaches.   You develop a rash.   You miss your menstrual period.   You develop abnormal vaginal bleeding.   You are losing your hair.   You need treatment for mood swings or depression.   You get dizzy when taking the OC.   You develop acne from taking the OC.  SEEK IMMEDIATE MEDICAL CARE IF:   You develop leg pain.   You develop chest pain.   You develop shortness of breath.   You develop abdominal pain.   You have an uncontrolled headache.   You develop numbness or slurred speech.   You develop visual problems (loss of vision, double, or blurry vision).   You develop heavy vaginal bleeding.  If you are taking the pill, STOP RIGHT AWAY and CALL YOUR CAREGIVER IMMEDIATELY if the following occur:  You develop chest pain and shortness of breath.   You develop pain, redness, and swelling in the legs.   You develop severe headaches, visual changes, or belly (abdominal) pain.   You develop severe depression.   You become pregnant.  Document Released: 07/03/2002 Document Revised: 05/15/2010 Document Reviewed: 04/24/2009 [employer] Patient Information 2012 [employer]. 

## 2011-07-14 ENCOUNTER — Ambulatory Visit (INDEPENDENT_AMBULATORY_CARE_PROVIDER_SITE_OTHER): Payer: Medicaid Other | Admitting: Family Medicine

## 2011-07-14 ENCOUNTER — Other Ambulatory Visit (HOSPITAL_COMMUNITY)
Admission: RE | Admit: 2011-07-14 | Discharge: 2011-07-14 | Disposition: A | Discharge status: Home / Self Care | Payer: Medicaid Other | Source: Ambulatory Visit | Attending: Family Medicine | Admitting: Family Medicine

## 2011-07-14 ENCOUNTER — Encounter: Payer: Self-pay | Admitting: Family Medicine

## 2011-07-14 VITALS — BP 112/75 | HR 92 | Ht 63.75 in | Wt 166.0 lb

## 2011-07-14 DIAGNOSIS — Z113 Encounter for screening for infections with a predominantly sexual mode of transmission: Secondary | ICD-10-CM

## 2011-07-14 DIAGNOSIS — Z309 Encounter for contraceptive management, unspecified: Secondary | ICD-10-CM

## 2011-07-14 DIAGNOSIS — N760 Acute vaginitis: Secondary | ICD-10-CM

## 2011-07-14 LAB — POCT WET PREP (WET MOUNT): Clue Cells Wet Prep Whiff POC: NEGATIVE

## 2011-07-14 MED ORDER — ETONOGESTREL-ETHINYL ESTRADIOL 0.12-0.015 MG/24HR VA RING: VAGINAL_RING | VAGINAL | Status: DC

## 2011-07-14 NOTE — Patient Instructions (Signed)
Birth control: Use nuvaring as directed.  Let me know if any concerns or problems.  STD screen: I will mail your results to you.  I will call if I need to give you any medications.   Return as needed.

## 2011-07-14 NOTE — Progress Notes (Signed)
  Subjective:    Patient ID: Jillian Hernandez, female    DOB: 10/29/1980, 31 y.o.   MRN: [id number]  HPI Birth control: Patient had IUD removed approximately one month ago. It was embedded in her uterus and patient did not want to put another one in. Therefore she is here to discuss other options for birth control. She knows she cannot remember birth control pills. She does not want the injection-Depo-Provera. Otherwise open to discuss other options. No history of hypertension. No history of migraines. No history of VTE.  STD screening: Patient request I screened her for sexually transmitted infections. Has been with same partner x5 years. Feels strongly that she is her boyfriend only partner. Has been using protected sex-condoms- since removal of IUD. Prior to that did not use condoms regularly. Had HIV and RPR drawn in January which were negative. Patient states she does not want this redrawn. But would like to be tested for gonorrhea and chlamydia as well as yeast and BV. Has white vaginal discharge x1 week. Thinks that this may be yeast. No itching. No foul odor. No urinary symptoms. Has not tried anything over-the-counter to treat this. No fever. No abdominal pain.  Review of Systems As per above.    Objective:   Physical Exam  Constitutional: She appears well-developed and well-nourished.  HENT:  Head: Normocephalic and atraumatic.  Neck: Normal range of motion.  Cardiovascular: Normal rate, regular rhythm and normal heart sounds.   No murmur heard. Pulmonary/Chest: Effort normal. No respiratory distress. She has no wheezes.  Abdominal: Soft. She exhibits no distension. There is no tenderness. There is no rebound and no guarding.  Genitourinary: Uterus normal. There is no rash, tenderness, lesion or injury on the right labia. There is no rash, tenderness, lesion or injury on the left labia. Cervix exhibits no motion tenderness, no discharge and no friability. Right adnexum displays no  mass, no tenderness and no fullness. Left adnexum displays no mass, no tenderness and no fullness.  Musculoskeletal: She exhibits no edema.  Neurological: She is alert.  Skin: No rash noted.  Psychiatric: She has a normal mood and affect.          Assessment & Plan:

## 2011-07-15 ENCOUNTER — Encounter: Payer: Self-pay | Admitting: Family Medicine

## 2011-07-15 NOTE — Assessment & Plan Note (Signed)
After discussion of birth control options pt decided that she would like to try nuvaring.  Rx sent to pharmacy.  Pt to return if any questions or concerns.

## 2011-07-15 NOTE — Assessment & Plan Note (Signed)
Negative for HIV and RPR in Jan 2013- did not want to be retested.  Pregnancy test negative. GC/Chlam/wet prep sent to lab.

## 2011-10-29 ENCOUNTER — Ambulatory Visit (INDEPENDENT_AMBULATORY_CARE_PROVIDER_SITE_OTHER): Payer: Medicaid Other | Admitting: *Deleted

## 2011-10-29 DIAGNOSIS — Z111 Encounter for screening for respiratory tuberculosis: Secondary | ICD-10-CM

## 2011-11-01 ENCOUNTER — Ambulatory Visit (INDEPENDENT_AMBULATORY_CARE_PROVIDER_SITE_OTHER): Payer: Medicaid Other | Admitting: *Deleted

## 2011-11-01 DIAGNOSIS — IMO0001 Reserved for inherently not codable concepts without codable children: Secondary | ICD-10-CM

## 2011-11-01 DIAGNOSIS — Z111 Encounter for screening for respiratory tuberculosis: Secondary | ICD-10-CM

## 2012-07-17 ENCOUNTER — Other Ambulatory Visit (HOSPITAL_COMMUNITY)
Admission: RE | Admit: 2012-07-17 | Discharge: 2012-07-17 | Disposition: A | Discharge status: Home / Self Care | Payer: Medicaid Other | Source: Ambulatory Visit | Attending: Family Medicine | Admitting: Family Medicine

## 2012-07-17 ENCOUNTER — Encounter: Payer: Self-pay | Admitting: Family Medicine

## 2012-07-17 ENCOUNTER — Ambulatory Visit (INDEPENDENT_AMBULATORY_CARE_PROVIDER_SITE_OTHER): Payer: Medicaid Other | Admitting: Family Medicine

## 2012-07-17 VITALS — BP 116/69 | HR 79 | Ht 63.75 in | Wt 172.0 lb

## 2012-07-17 DIAGNOSIS — N72 Inflammatory disease of cervix uteri: Secondary | ICD-10-CM

## 2012-07-17 DIAGNOSIS — N898 Other specified noninflammatory disorders of vagina: Secondary | ICD-10-CM

## 2012-07-17 DIAGNOSIS — Z113 Encounter for screening for infections with a predominantly sexual mode of transmission: Secondary | ICD-10-CM | POA: Insufficient documentation

## 2012-07-17 DIAGNOSIS — Z0000 Encounter for general adult medical examination without abnormal findings: Secondary | ICD-10-CM

## 2012-07-17 DIAGNOSIS — Z01419 Encounter for gynecological examination (general) (routine) without abnormal findings: Secondary | ICD-10-CM

## 2012-07-17 DIAGNOSIS — B373 Candidiasis of vulva and vagina: Secondary | ICD-10-CM

## 2012-07-17 LAB — POCT WET PREP (WET MOUNT): Clue Cells Wet Prep Whiff POC: NEGATIVE

## 2012-07-17 MED ORDER — FLUCONAZOLE 150 MG PO TABS: 150.0000 mg | ORAL_TABLET | Freq: Once | ORAL | Status: DC

## 2012-07-17 NOTE — Assessment & Plan Note (Signed)
Check GC/Chlam per her request. Not due for pap- normal 1 year ago w/o h/o any abnml. Declines flu shot.

## 2012-07-17 NOTE — Assessment & Plan Note (Addendum)
Yeast on wet prep- treat with diflucan 150mg  po x1 since no symptoms.  Called pt and informed her of the result.

## 2012-07-17 NOTE — Patient Instructions (Addendum)
  It was nice to see you today.  I will send you a letter with your results-- I will call if anything is positive.  Come back at needed or in 1 year for your next well woman exam. Your next pap smear is due 06/2013.

## 2012-07-17 NOTE — Progress Notes (Signed)
  Subjective:     Jillian Hernandez is a 32 y.o. female and is here for a comprehensive physical exam. The patient reports problems - :.  Vaginal d/c-- would like to be checked for STIs.  Has 1 partner for the past >1 year; negative last year but would like to be checked again today.  No irregular bleeding, occasionally has d/c and will use metrogel for 1 day.   Using novuaring for birth control.   History   Social History  . Marital Status: Single    Spouse Name: N/A    Number of Children: N/A  . Years of Education: N/A   Occupational History  . Not on file.   Social History Main Topics  . Smoking status: Never Smoker   . Smokeless tobacco: Never Used  . Alcohol Use: Yes  . Drug Use: No  . Sexually Active: Yes    Birth Control/ Protection: IUD   Other Topics Concern  . Not on file   Social History Narrative  . No narrative on file   Health Maintenance  Topic Date Due  . Influenza Vaccine  12/26/2011  . Pap Smear  07/02/2013  . Tetanus/tdap  11/02/2017    The following portions of the patient's history were reviewed and updated as appropriate: allergies, current medications, past family history, past medical history, past social history, past surgical history and problem list.  Review of Systems Constitutional: negative for night sweats and weight loss Ears, nose, mouth, throat, and face: negative for sore throat, tinnitus and voice change Respiratory: negative for cough, dyspnea on exertion and wheezing Cardiovascular: negative for chest pain, chest pressure/discomfort, exertional chest pressure/discomfort, lower extremity edema, near-syncope, palpitations and syncope Gastrointestinal: negative for change in bowel habits, constipation, diarrhea, nausea and vomiting Genitourinary:positive for vaginal discharge, negative for sexual problems, dysuria, frequency and hematuria Neurological: negative for dizziness, gait problems and weakness   Objective:    There were no  vitals taken for this visit. General appearance: alert, cooperative, appears stated age and no distress Head: Normocephalic, without obvious abnormality, atraumatic Eyes: conjunctivae/corneas clear. PERRL, EOM's intact. Fundi benign. Neck: no adenopathy, supple, symmetrical, trachea midline and thyroid not enlarged, symmetric, no tenderness/mass/nodules Lungs: clear to auscultation bilaterally Heart: regular rate and rhythm, S1, S2 normal, no murmur, click, rub or gallop Abdomen: soft, non-tender; bowel sounds normal; no masses,  no organomegaly Pelvic: cervix normal in appearance, external genitalia normal, no adnexal masses or tenderness, no cervical motion tenderness, rectovaginal septum normal, uterus normal size, shape, and consistency and vagina normal without discharge Pulses: 2+ and symmetric Neurologic: Grossly normal    Assessment:    Healthy female exam.      Plan:     See After Visit Summary for Counseling Recommendations

## 2012-07-18 ENCOUNTER — Encounter: Payer: Self-pay | Admitting: Family Medicine

## 2012-08-25 ENCOUNTER — Other Ambulatory Visit: Payer: Self-pay | Admitting: Family Medicine

## 2012-09-06 ENCOUNTER — Ambulatory Visit (INDEPENDENT_AMBULATORY_CARE_PROVIDER_SITE_OTHER): Payer: Medicaid Other | Admitting: Family Medicine

## 2012-09-06 ENCOUNTER — Other Ambulatory Visit (HOSPITAL_COMMUNITY)
Admission: RE | Admit: 2012-09-06 | Discharge: 2012-09-06 | Disposition: A | Discharge status: Home / Self Care | Payer: Medicaid Other | Source: Ambulatory Visit | Attending: Family Medicine | Admitting: Family Medicine

## 2012-09-06 ENCOUNTER — Encounter: Payer: Self-pay | Admitting: Family Medicine

## 2012-09-06 ENCOUNTER — Other Ambulatory Visit: Payer: Self-pay | Admitting: Family Medicine

## 2012-09-06 VITALS — BP 113/77 | HR 97 | Ht 64.0 in | Wt 172.0 lb

## 2012-09-06 DIAGNOSIS — Z113 Encounter for screening for infections with a predominantly sexual mode of transmission: Secondary | ICD-10-CM | POA: Insufficient documentation

## 2012-09-06 DIAGNOSIS — Z7251 High risk heterosexual behavior: Secondary | ICD-10-CM

## 2012-09-06 DIAGNOSIS — N898 Other specified noninflammatory disorders of vagina: Secondary | ICD-10-CM

## 2012-09-06 LAB — POCT WET PREP (WET MOUNT): Clue Cells Wet Prep Whiff POC: POSITIVE

## 2012-09-06 MED ORDER — METRONIDAZOLE 0.75 % VA GEL: VAGINAL | Status: DC

## 2012-09-07 DIAGNOSIS — N898 Other specified noninflammatory disorders of vagina: Secondary | ICD-10-CM | POA: Insufficient documentation

## 2012-09-07 NOTE — Progress Notes (Signed)
  Subjective:    Patient ID: Tricia Pledger, female    DOB: [birth year], 32 y.o.   MRN: [id number]  Vaginal Discharge The patient's primary symptoms include a vaginal discharge.    1. Vaginal d/c:  C/o vaginal d/c x1 week.  Has a history of recurrent BV and usually has metrogel on hand to use for this, however she is out.  Discharge is white-gray and has a foul smelling odor.  She denies dysuria, abdominal pain, fever, chills.   Review of Systems  Genitourinary: Positive for vaginal discharge.   Per HPI    Objective:   Physical Exam  Constitutional: She appears well-nourished. No distress.  Abdominal: Soft. She exhibits no distension. There is no tenderness.  Genitourinary: Cervix exhibits no motion tenderness and no discharge. Right adnexum displays no tenderness and no fullness. Left adnexum displays no tenderness and no fullness. No bleeding around the vagina. Vaginal discharge (small white discharge) found.          Assessment & Plan:

## 2012-09-07 NOTE — Assessment & Plan Note (Signed)
Small amount of discharge.  Wet prep and GC/Chlamydia done.  Will let her know results.

## 2012-09-08 ENCOUNTER — Telehealth: Payer: Self-pay | Admitting: *Deleted

## 2012-09-08 NOTE — Telephone Encounter (Signed)
Called pt and informed. .Jillian Hernandez  

## 2012-09-08 NOTE — Telephone Encounter (Signed)
Message copied by Arlyss Repress on Fri Sep 08, 2012  2:56 PM ------      Message from: Everrett Coombe      Created: Fri Sep 08, 2012  2:12 PM       Please let her know that she has BV.  Can use metrogel that I sent in for this. ------

## 2012-10-25 ENCOUNTER — Telehealth: Payer: Self-pay | Admitting: Family Medicine

## 2012-10-25 DIAGNOSIS — N898 Other specified noninflammatory disorders of vagina: Secondary | ICD-10-CM

## 2012-10-25 MED ORDER — METRONIDAZOLE 0.75 % VA GEL: VAGINAL | Status: DC

## 2012-10-25 NOTE — Telephone Encounter (Signed)
[facility] on [address] will be sending a refill request for Metronidazole.  The patients dog got a hold of her last tube so she needs another.

## 2012-10-25 NOTE — Telephone Encounter (Signed)
Will re-send Rx.   Kevin Fenton,  10/25/2012, 5:05 PM

## 2012-10-25 NOTE — Telephone Encounter (Signed)
Will forward to Dr. Bradshaw 

## 2012-10-26 NOTE — Telephone Encounter (Signed)
Called and gave message to patient.Jillian Hernandez, [name]

## 2012-10-27 ENCOUNTER — Emergency Department (HOSPITAL_COMMUNITY): Payer: Medicaid Other

## 2012-10-27 ENCOUNTER — Encounter (HOSPITAL_COMMUNITY): Admission: EM | Disposition: A | Discharge status: Home / Self Care | Payer: Self-pay | Source: Home / Self Care

## 2012-10-27 ENCOUNTER — Inpatient Hospital Stay (HOSPITAL_COMMUNITY): Payer: Medicaid Other

## 2012-10-27 ENCOUNTER — Inpatient Hospital Stay (HOSPITAL_COMMUNITY): Payer: Medicaid Other | Admitting: Certified Registered"

## 2012-10-27 ENCOUNTER — Encounter (HOSPITAL_COMMUNITY): Payer: Self-pay | Admitting: Certified Registered"

## 2012-10-27 ENCOUNTER — Inpatient Hospital Stay (HOSPITAL_COMMUNITY)
Admission: EM | Admit: 2012-10-27 | Discharge: 2012-11-04 | DRG: 958 | Disposition: A | Discharge status: Home / Self Care | Payer: Medicaid Other | Attending: General Surgery | Admitting: General Surgery

## 2012-10-27 ENCOUNTER — Encounter (HOSPITAL_COMMUNITY): Payer: Self-pay

## 2012-10-27 DIAGNOSIS — D62 Acute posthemorrhagic anemia: Secondary | ICD-10-CM | POA: Diagnosis not present

## 2012-10-27 DIAGNOSIS — S1091XA Abrasion of unspecified part of neck, initial encounter: Secondary | ICD-10-CM

## 2012-10-27 DIAGNOSIS — IMO0002 Reserved for concepts with insufficient information to code with codable children: Principal | ICD-10-CM | POA: Diagnosis present

## 2012-10-27 DIAGNOSIS — T368X5A Adverse effect of other systemic antibiotics, initial encounter: Secondary | ICD-10-CM | POA: Diagnosis not present

## 2012-10-27 DIAGNOSIS — Y92410 Unspecified street and highway as the place of occurrence of the external cause: Secondary | ICD-10-CM

## 2012-10-27 DIAGNOSIS — N179 Acute kidney failure, unspecified: Secondary | ICD-10-CM | POA: Diagnosis not present

## 2012-10-27 DIAGNOSIS — S48112A Complete traumatic amputation at level between left shoulder and elbow, initial encounter: Secondary | ICD-10-CM

## 2012-10-27 DIAGNOSIS — Y9210 Unspecified residential institution as the place of occurrence of the external cause: Secondary | ICD-10-CM | POA: Diagnosis not present

## 2012-10-27 DIAGNOSIS — S27899A Unspecified injury of other specified intrathoracic organs, initial encounter: Secondary | ICD-10-CM

## 2012-10-27 DIAGNOSIS — G547 Phantom limb syndrome without pain: Secondary | ICD-10-CM | POA: Diagnosis not present

## 2012-10-27 HISTORY — PX: AMPUTATION: SHX166

## 2012-10-27 LAB — ABO/RH: ABO/RH(D): A POS

## 2012-10-27 LAB — COMPREHENSIVE METABOLIC PANEL
ALT: 15 U/L (ref 0–35)
Albumin: 3.1 g/dL — ABNORMAL LOW (ref 3.5–5.2)
Alkaline Phosphatase: 44 U/L (ref 39–117)
Calcium: 7.3 mg/dL — ABNORMAL LOW (ref 8.4–10.5)
GFR calc Af Amer: 88 mL/min — ABNORMAL LOW (ref >90)
Glucose, Bld: 153 mg/dL — ABNORMAL HIGH (ref 70–99)
Potassium: 3.2 mEq/L — ABNORMAL LOW (ref 3.5–5.1)
Sodium: 136 mEq/L (ref 135–145)
Total Protein: 5.9 g/dL — ABNORMAL LOW (ref 6.0–8.3)

## 2012-10-27 LAB — CBC
HCT: 32.8 % — ABNORMAL LOW (ref 36.0–46.0)
Hemoglobin: 11.6 g/dL — ABNORMAL LOW (ref 12.0–15.0)
MCHC: 35.4 g/dL (ref 30.0–36.0)
MCV: 91.9 fL (ref 78.0–100.0)
WBC: 5.8 10*3/uL (ref 4.0–10.5)

## 2012-10-27 LAB — URINALYSIS, ROUTINE W REFLEX MICROSCOPIC
Glucose, UA: NEGATIVE mg/dL
Leukocytes, UA: NEGATIVE
Nitrite: NEGATIVE
Specific Gravity, Urine: 1.023 (ref 1.005–1.030)
pH: 5.5 (ref 5.0–8.0)

## 2012-10-27 LAB — PROTIME-INR: INR: 1.13 (ref 0.00–1.49)

## 2012-10-27 LAB — POCT I-STAT, CHEM 8
Glucose, Bld: 152 mg/dL — ABNORMAL HIGH (ref 70–99)
Hemoglobin: 11.9 g/dL — ABNORMAL LOW (ref 12.0–15.0)
Potassium: 3.3 mEq/L — ABNORMAL LOW (ref 3.5–5.1)
Sodium: 141 mEq/L (ref 135–145)
TCO2: 19 mmol/L (ref 0–100)

## 2012-10-27 LAB — URINE MICROSCOPIC-ADD ON

## 2012-10-27 SURGERY — AMPUTATION BELOW KNEE
Anesthesia: General | Site: Arm Upper | Laterality: Left | Wound class: Dirty or Infected

## 2012-10-27 MED ORDER — PANTOPRAZOLE SODIUM 40 MG IV SOLR
40.0000 mg | Freq: Every day | INTRAVENOUS | Status: DC
  Administered 2012-10-27 – 2012-10-28 (×2): 40 mg via INTRAVENOUS
  Filled 2012-10-27 (×5): qty 40

## 2012-10-27 MED ORDER — SODIUM CHLORIDE 0.9 % IV SOLN
1500.0000 mg | INTRAVENOUS | Status: AC
  Administered 2012-10-27: 1000 mg via INTRAVENOUS
  Filled 2012-10-27: qty 1500

## 2012-10-27 MED ORDER — PIPERACILLIN-TAZOBACTAM 3.375 G IVPB
3.3750 g | Freq: Three times a day (TID) | INTRAVENOUS | Status: DC
  Administered 2012-10-27 – 2012-11-02 (×17): 3.375 g via INTRAVENOUS
  Filled 2012-10-27 (×20): qty 50

## 2012-10-27 MED ORDER — BIOTENE DRY MOUTH MT LIQD
15.0000 mL | Freq: Four times a day (QID) | OROMUCOSAL | Status: DC
  Administered 2012-10-27 – 2012-10-28 (×3): 15 mL via OROMUCOSAL

## 2012-10-27 MED ORDER — IOHEXOL 300 MG/ML  SOLN
100.0000 mL | Freq: Once | INTRAMUSCULAR | Status: AC | PRN
  Administered 2012-10-27: 100 mL via INTRAVENOUS

## 2012-10-27 MED ORDER — SUCCINYLCHOLINE CHLORIDE 20 MG/ML IJ SOLN
INTRAMUSCULAR | Status: AC
  Filled 2012-10-27: qty 1

## 2012-10-27 MED ORDER — LACTATED RINGERS IV SOLN
INTRAVENOUS | Status: DC | PRN
  Administered 2012-10-27 (×2): via INTRAVENOUS

## 2012-10-27 MED ORDER — LIDOCAINE HCL (CARDIAC) 20 MG/ML IV SOLN
INTRAVENOUS | Status: AC | PRN
  Administered 2012-10-27: 100 mg via INTRAVENOUS

## 2012-10-27 MED ORDER — FENTANYL CITRATE 0.05 MG/ML IJ SOLN
INTRAMUSCULAR | Status: AC
  Filled 2012-10-27: qty 2

## 2012-10-27 MED ORDER — WHITE PETROLATUM GEL: Status: DC | PRN

## 2012-10-27 MED ORDER — DIPHENHYDRAMINE HCL 50 MG/ML IJ SOLN
12.5000 mg | Freq: Four times a day (QID) | INTRAMUSCULAR | Status: DC | PRN
  Administered 2012-10-29: 12.5 mg via INTRAVENOUS
  Filled 2012-10-27: qty 1

## 2012-10-27 MED ORDER — TETANUS-DIPHTH-ACELL PERTUSSIS 5-2.5-18.5 LF-MCG/0.5 IM SUSP
0.5000 mL | Freq: Once | INTRAMUSCULAR | Status: AC
  Administered 2012-10-28: 0.5 mL via INTRAMUSCULAR
  Filled 2012-10-27 (×2): qty 0.5

## 2012-10-27 MED ORDER — GUAIFENESIN 100 MG/5ML PO SOLN
5.0000 mL | ORAL | Status: DC | PRN
  Administered 2012-10-28: 100 mg via ORAL
  Filled 2012-10-27 (×2): qty 5

## 2012-10-27 MED ORDER — DEXTROSE-NACL 5-0.45 % IV SOLN
INTRAVENOUS | Status: DC
  Administered 2012-10-27 – 2012-10-28 (×3): via INTRAVENOUS
  Administered 2012-10-29: 75 mL/h via INTRAVENOUS

## 2012-10-27 MED ORDER — PIPERACILLIN-TAZOBACTAM 3.375 G IVPB
3.3750 g | Freq: Three times a day (TID) | INTRAVENOUS | Status: DC
  Administered 2012-10-27: 3.375 g via INTRAVENOUS
  Filled 2012-10-27 (×3): qty 50

## 2012-10-27 MED ORDER — SUCCINYLCHOLINE CHLORIDE 20 MG/ML IJ SOLN
INTRAMUSCULAR | Status: AC | PRN
  Administered 2012-10-27: 100 mg via INTRAVENOUS

## 2012-10-27 MED ORDER — ETOMIDATE 2 MG/ML IV SOLN
INTRAVENOUS | Status: AC
  Filled 2012-10-27: qty 20

## 2012-10-27 MED ORDER — HYDROMORPHONE HCL PF 1 MG/ML IJ SOLN
INTRAMUSCULAR | Status: AC
  Filled 2012-10-27: qty 1

## 2012-10-27 MED ORDER — ROCURONIUM BROMIDE 100 MG/10ML IV SOLN
INTRAVENOUS | Status: DC | PRN
  Administered 2012-10-27: 50 mg via INTRAVENOUS

## 2012-10-27 MED ORDER — MIDAZOLAM HCL 5 MG/5ML IJ SOLN
INTRAMUSCULAR | Status: DC | PRN
  Administered 2012-10-27: 2 mg via INTRAVENOUS

## 2012-10-27 MED ORDER — HYDROMORPHONE 0.3 MG/ML IV SOLN
INTRAVENOUS | Status: DC
  Administered 2012-10-27: 2.9 mg via INTRAVENOUS
  Administered 2012-10-27: 3.9 mL via INTRAVENOUS
  Administered 2012-10-27 – 2012-10-28 (×2): via INTRAVENOUS
  Administered 2012-10-28: 3.3 mg via INTRAVENOUS
  Administered 2012-10-28: 3 mg via INTRAVENOUS
  Administered 2012-10-28: 2.4 mg via INTRAVENOUS
  Administered 2012-10-28: 0.9 mg via INTRAVENOUS
  Administered 2012-10-29: 2.1 mg via INTRAVENOUS
  Administered 2012-10-29: 0.3 mg via INTRAVENOUS
  Administered 2012-10-29: 12:00:00 via INTRAVENOUS
  Administered 2012-10-29 (×2): 1.5 mg via INTRAVENOUS
  Administered 2012-10-30: 1.8 mg via INTRAVENOUS
  Administered 2012-10-30: 1.2 mg via INTRAVENOUS
  Administered 2012-10-30: 0.9 mg via INTRAVENOUS
  Filled 2012-10-27 (×5): qty 25

## 2012-10-27 MED ORDER — NALOXONE HCL 0.4 MG/ML IJ SOLN: 0.4000 mg | INTRAMUSCULAR | Status: DC | PRN

## 2012-10-27 MED ORDER — LIDOCAINE HCL (CARDIAC) 20 MG/ML IV SOLN
INTRAVENOUS | Status: AC
  Filled 2012-10-27: qty 5

## 2012-10-27 MED ORDER — FENTANYL CITRATE 0.05 MG/ML IJ SOLN
INTRAMUSCULAR | Status: DC | PRN
  Administered 2012-10-27: 50 ug via INTRAVENOUS
  Administered 2012-10-27: 150 ug via INTRAVENOUS

## 2012-10-27 MED ORDER — HYDROMORPHONE HCL PF 1 MG/ML IJ SOLN
INTRAMUSCULAR | Status: AC | PRN
  Administered 2012-10-27: 1 mg

## 2012-10-27 MED ORDER — MIDAZOLAM HCL 5 MG/5ML IJ SOLN
INTRAMUSCULAR | Status: AC | PRN
  Administered 2012-10-27: 5 mg via INTRAVENOUS

## 2012-10-27 MED ORDER — SODIUM CHLORIDE 0.9 % IV SOLN
1500.0000 mg | Freq: Two times a day (BID) | INTRAVENOUS | Status: DC
  Administered 2012-10-27 – 2012-10-30 (×7): 1500 mg via INTRAVENOUS
  Filled 2012-10-27 (×11): qty 1500

## 2012-10-27 MED ORDER — NEOSTIGMINE METHYLSULFATE 1 MG/ML IJ SOLN
INTRAMUSCULAR | Status: DC | PRN
  Administered 2012-10-27: 3 mg via INTRAVENOUS

## 2012-10-27 MED ORDER — CHLORHEXIDINE GLUCONATE 0.12 % MT SOLN
15.0000 mL | Freq: Two times a day (BID) | OROMUCOSAL | Status: DC
  Administered 2012-10-27: 15 mL via OROMUCOSAL

## 2012-10-27 MED ORDER — SODIUM CHLORIDE 0.9 % IV SOLN
INTRAVENOUS | Status: DC | PRN
  Administered 2012-10-27: 10:00:00 via INTRAVENOUS

## 2012-10-27 MED ORDER — PANTOPRAZOLE SODIUM 40 MG PO TBEC
40.0000 mg | DELAYED_RELEASE_TABLET | Freq: Every day | ORAL | Status: DC
  Administered 2012-10-29: 40 mg via ORAL
  Filled 2012-10-27: qty 1

## 2012-10-27 MED ORDER — SODIUM CHLORIDE 0.9 % IJ SOLN: 9.0000 mL | INTRAMUSCULAR | Status: DC | PRN

## 2012-10-27 MED ORDER — ROCURONIUM BROMIDE 50 MG/5ML IV SOLN
INTRAVENOUS | Status: AC | PRN
  Administered 2012-10-27: 100 mg via INTRAVENOUS

## 2012-10-27 MED ORDER — FENTANYL CITRATE 0.05 MG/ML IJ SOLN
INTRAMUSCULAR | Status: AC | PRN
  Administered 2012-10-27: 100 ug via INTRAVENOUS

## 2012-10-27 MED ORDER — DIPHENHYDRAMINE HCL 12.5 MG/5ML PO ELIX
12.5000 mg | ORAL_SOLUTION | Freq: Four times a day (QID) | ORAL | Status: DC | PRN
  Administered 2012-10-28 – 2012-10-29 (×4): 12.5 mg via ORAL
  Filled 2012-10-27 (×2): qty 10
  Filled 2012-10-27: qty 5
  Filled 2012-10-27: qty 10

## 2012-10-27 MED ORDER — LORAZEPAM 2 MG/ML IJ SOLN
INTRAMUSCULAR | Status: AC | PRN
  Administered 2012-10-27: 2 mg via INTRAVENOUS

## 2012-10-27 MED ORDER — 0.9 % SODIUM CHLORIDE (POUR BTL) OPTIME
TOPICAL | Status: DC | PRN
  Administered 2012-10-27: 1000 mL
  Administered 2012-10-27: 4000 mL
  Administered 2012-10-27 (×2): 1000 mL

## 2012-10-27 MED ORDER — CEFAZOLIN SODIUM-DEXTROSE 2-3 GM-% IV SOLR: 2.0000 g | Freq: Once | INTRAVENOUS | Status: DC

## 2012-10-27 MED ORDER — MIDAZOLAM HCL 2 MG/2ML IJ SOLN
INTRAMUSCULAR | Status: AC
  Filled 2012-10-27: qty 6

## 2012-10-27 MED ORDER — ROCURONIUM BROMIDE 50 MG/5ML IV SOLN
INTRAVENOUS | Status: AC
  Filled 2012-10-27: qty 2

## 2012-10-27 MED ORDER — VECURONIUM BROMIDE 10 MG IV SOLR
INTRAVENOUS | Status: AC
  Filled 2012-10-27: qty 20

## 2012-10-27 MED ORDER — PIPERACILLIN-TAZOBACTAM 3.375 G IVPB
3.3750 g | INTRAVENOUS | Status: AC
  Filled 2012-10-27 (×2): qty 50

## 2012-10-27 MED ORDER — ETOMIDATE 2 MG/ML IV SOLN
INTRAVENOUS | Status: AC | PRN
  Administered 2012-10-27: 20 mg via INTRAVENOUS

## 2012-10-27 MED ORDER — ONDANSETRON HCL 4 MG/2ML IJ SOLN: 4.0000 mg | Freq: Four times a day (QID) | INTRAMUSCULAR | Status: DC | PRN

## 2012-10-27 MED ORDER — ONDANSETRON HCL 4 MG/2ML IJ SOLN
INTRAMUSCULAR | Status: DC | PRN
  Administered 2012-10-27: 4 mg via INTRAVENOUS

## 2012-10-27 MED ORDER — GLYCOPYRROLATE 0.2 MG/ML IJ SOLN
INTRAMUSCULAR | Status: DC | PRN
  Administered 2012-10-27: 0.4 mg via INTRAVENOUS

## 2012-10-27 MED ORDER — LORAZEPAM 2 MG/ML IJ SOLN
INTRAMUSCULAR | Status: AC
  Filled 2012-10-27: qty 2

## 2012-10-27 MED ORDER — PHENYLEPHRINE HCL 10 MG/ML IJ SOLN
INTRAMUSCULAR | Status: DC | PRN
  Administered 2012-10-27: 40 ug via INTRAVENOUS
  Administered 2012-10-27: 80 ug via INTRAVENOUS
  Administered 2012-10-27: 40 ug via INTRAVENOUS
  Administered 2012-10-27: 80 ug via INTRAVENOUS

## 2012-10-27 MED ORDER — SODIUM CHLORIDE 0.9 % IR SOLN
Status: DC | PRN
  Administered 2012-10-27 (×2): 3000 mL

## 2012-10-27 MED ORDER — PIPERACILLIN-TAZOBACTAM 3.375 G IVPB 30 MIN: 3.3750 g | INTRAVENOUS | Status: AC

## 2012-10-27 SURGICAL SUPPLY — 59 items
BANDAGE ELASTIC 6 VELCRO ST LF (GAUZE/BANDAGES/DRESSINGS) ×1 IMPLANT
BANDAGE GAUZE ELAST BULKY 4 IN (GAUZE/BANDAGES/DRESSINGS) ×2 IMPLANT
BLADE SAW SGTL 83.5X18.5 (BLADE) ×1 IMPLANT
BLADE STRAIGHT ST CBS 30 3 (BLADE) ×2 IMPLANT
BLADE SURG 10 STRL SS (BLADE) ×5 IMPLANT
BNDG CMPR 9X4 STRL LF SNTH (GAUZE/BANDAGES/DRESSINGS) ×1
BNDG COHESIVE 6X5 TAN STRL LF (GAUZE/BANDAGES/DRESSINGS) ×2 IMPLANT
BNDG ESMARK 4X9 LF (GAUZE/BANDAGES/DRESSINGS) ×2 IMPLANT
CLOTH BEACON ORANGE TIMEOUT ST (SAFETY) ×2 IMPLANT
COVER SURGICAL LIGHT HANDLE (MISCELLANEOUS) ×2 IMPLANT
CUFF TOURNIQUET SINGLE 34IN LL (TOURNIQUET CUFF) IMPLANT
CUFF TOURNIQUET SINGLE 44IN (TOURNIQUET CUFF) IMPLANT
DRAIN PENROSE 1/2X12 LTX STRL (WOUND CARE) IMPLANT
DRAPE EXTREMITY T 121X128X90 (DRAPE) ×2 IMPLANT
DRAPE INCISE IOBAN 66X45 STRL (DRAPES) ×1 IMPLANT
DRAPE ORTHO SPLIT 77X108 STRL (DRAPES) ×4
DRAPE PROXIMA HALF (DRAPES) ×4 IMPLANT
DRAPE SURG ORHT 6 SPLT 77X108 (DRAPES) IMPLANT
DRAPE U-SHAPE 47X51 STRL (DRAPES) ×3 IMPLANT
DRSG PAD ABDOMINAL 8X10 ST (GAUZE/BANDAGES/DRESSINGS) ×8 IMPLANT
DURAPREP 26ML APPLICATOR (WOUND CARE) ×2 IMPLANT
ELECT CAUTERY BLADE 6.4 (BLADE) IMPLANT
EVACUATOR 1/8 PVC DRAIN (DRAIN) IMPLANT
GLOVE BIO SURGEON STRL SZ 6 (GLOVE) ×1 IMPLANT
GLOVE BIOGEL PI IND STRL 7.0 (GLOVE) ×1 IMPLANT
GLOVE BIOGEL PI IND STRL 8 (GLOVE) IMPLANT
GLOVE BIOGEL PI INDICATOR 7.0 (GLOVE) ×1
GLOVE BIOGEL PI INDICATOR 8 (GLOVE) ×1
GLOVE SURG ORTHO 8.0 STRL STRW (GLOVE) ×3 IMPLANT
GOWN PREVENTION PLUS LG XLONG (DISPOSABLE) IMPLANT
GOWN PREVENTION PLUS XLARGE (GOWN DISPOSABLE) ×2 IMPLANT
GOWN STRL NON-REIN LRG LVL3 (GOWN DISPOSABLE) ×3 IMPLANT
HANDPIECE INTERPULSE COAX TIP (DISPOSABLE) ×2
KIT ROOM TURNOVER OR (KITS) ×2 IMPLANT
MANIFOLD NEPTUNE II (INSTRUMENTS) ×2 IMPLANT
NS IRRIG 1000ML POUR BTL (IV SOLUTION) ×5 IMPLANT
PACK GENERAL/GYN (CUSTOM PROCEDURE TRAY) ×2 IMPLANT
PAD ARMBOARD 7.5X6 YLW CONV (MISCELLANEOUS) ×4 IMPLANT
SET HNDPC FAN SPRY TIP SCT (DISPOSABLE) ×1 IMPLANT
SOAP 2 % CHG 4 OZ (WOUND CARE) ×3 IMPLANT
SPONGE GAUZE 4X4 12PLY (GAUZE/BANDAGES/DRESSINGS) ×4 IMPLANT
SPONGE LAP 18X18 X RAY DECT (DISPOSABLE) ×3 IMPLANT
STAPLER VISISTAT 35W (STAPLE) IMPLANT
STOCKINETTE IMPERVIOUS LG (DRAPES) IMPLANT
SUT PDS AB 1 CT  36 (SUTURE)
SUT PDS AB 1 CT 36 (SUTURE) IMPLANT
SUT SILK 2 0 (SUTURE) ×4
SUT SILK 2 0 SH CR/8 (SUTURE) IMPLANT
SUT SILK 2 0SH CR/8 30 (SUTURE) ×1 IMPLANT
SUT SILK 2-0 18XBRD TIE 12 (SUTURE) ×1 IMPLANT
SUT VIC AB 0 CT1 27 (SUTURE) ×2
SUT VIC AB 0 CT1 27XBRD ANBCTR (SUTURE) ×1 IMPLANT
SUT VIC AB 2-0 CT1 27 (SUTURE) ×4
SUT VIC AB 2-0 CT1 TAPERPNT 27 (SUTURE) ×2 IMPLANT
SWAB COLLECTION DEVICE MRSA (MISCELLANEOUS) IMPLANT
TOWEL OR 17X24 6PK STRL BLUE (TOWEL DISPOSABLE) ×2 IMPLANT
TOWEL OR 17X26 10 PK STRL BLUE (TOWEL DISPOSABLE) ×3 IMPLANT
TUBE ANAEROBIC SPECIMEN COL (MISCELLANEOUS) IMPLANT
WATER STERILE IRR 1000ML POUR (IV SOLUTION) ×2 IMPLANT

## 2012-10-27 NOTE — Brief Op Note (Signed)
10/27/2012  12:10 PM  PATIENT:  Jillian Hernandez  32 y.o. female  PRE-OPERATIVE DIAGNOSIS:  PARTIAL AMPUTATION LEFT ARM  POST-OPERATIVE DIAGNOSIS:  COMPLETE AMPUTATION LEFT ARM, PROCEDURE:  Procedure(s): COMPLETION AMPUTATION LEFT ARM with extensive debridement  SURGEON:  Surgeon(s): Cammy Copa, MD  ASSISTANT:   ANESTHESIA:   general  EBL: 100 ml    Total I/O In: 1500 [I.V.:1500] Out: 250 [Urine:250]  BLOOD ADMINISTERED: none  DRAINS: none   LOCAL MEDICATIONS USED:  none  SPECIMEN:  No Specimen  COUNTS:  YES  TOURNIQUET:    DICTATION: .Other Dictation: Dictation Number [id number]  PLAN OF CARE: Admit to inpatient   PATIENT DISPOSITION:  PACU - hemodynamically stable
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
## 2012-10-27 NOTE — Anesthesia Procedure Notes (Signed)
Anesthesia Regional Block:  Interscalene brachial plexus block  Pre-Anesthetic Checklist: ,, timeout performed, Correct Patient, Correct Site, Correct Laterality, Correct Procedure, Correct Position, site marked, surgical consent, pre-op evaluation,  At surgeon's request and post-op pain management  Laterality: Left  Prep: chloraprep       Needles:  Injection technique: Single-shot  Needle Type: Echogenic Stimulator Needle     Needle Length:cm 9 cm Needle Gauge: 22 and 22 G    Additional Needles:  Procedures: ultrasound guided (picture in chart) and nerve stimulator Interscalene brachial plexus block Narrative:  Start time: 10/27/2012 12:40 PM End time: 10/27/2012 12:45 PM Injection made incrementally with aspirations every 5 mL.  Performed by: Personally   Additional Notes: 20 cc 0.5% marcaine 1:200 Epi injected easily

## 2012-10-27 NOTE — Preoperative (Signed)
Beta Blockers   Reason not to administer Beta Blockers:Not Applicable 
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
## 2012-10-27 NOTE — Progress Notes (Signed)
UR completed 
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
## 2012-10-27 NOTE — Procedures (Signed)
Central Venous Catheter Insertion Procedure Note Jillian Hernandez [id number] 10-24-1980  Procedure: Insertion of Central Venous Catheter Indications: Assessment of intravascular volume  Procedure Details Consent: Unable to obtain consent because of emergent medical necessity. Time Out: Verified patient identification, verified procedure, site/side was marked, verified correct patient position, special equipment/implants available, medications/allergies/relevent history reviewed, required imaging and test results available.  Performed  Maximum sterile technique was used including antiseptics, gloves and hand hygiene. Skin prep: Iodine solution; local anesthetic administered A antimicrobial bonded/coated triple lumen catheter was placed in the right femoral vein due to emergent situation using the Seldinger technique.  Evaluation Blood flow good Complications: No apparent complications Patient did tolerate procedure well. Chest X-ray ordered to verify placement.  CXR: not done or needed for this procedure.Cherylynn Ridges 10/27/2012, 10:57 AM

## 2012-10-27 NOTE — Anesthesia Preprocedure Evaluation (Signed)
Anesthesia Evaluation  Patient identified by MRN, date of birth, ID band Patient unresponsive    Reviewed: Unable to perform ROS - Chart review only  Airway Mallampati: II  Neck ROM: Limited    Dental  (+) Teeth Intact   Pulmonary          Cardiovascular     Neuro/Psych    GI/Hepatic   Endo/Other    Renal/GU      Musculoskeletal   Abdominal   Peds  Hematology   Anesthesia Other Findings 7.79mm Sub-glottic suction OETT in place, secured at 23 cm at teeth.  Cervical collar in place.  Reproductive/Obstetrics                           Anesthesia Physical Anesthesia Plan  ASA: II and emergent  Anesthesia Plan: General   Post-op Pain Management:    Induction: Inhalational  Airway Management Planned: Oral ETT  Additional Equipment:   Intra-op Plan:   Post-operative Plan: Possible Post-op intubation/ventilation  Informed Consent: I have reviewed the patients History and Physical, chart, labs and discussed the procedure including the risks, benefits and alternatives for the proposed anesthesia with the patient or authorized representative who has indicated his/her understanding and acceptance.   History available from chart only  Plan Discussed with: CRNA, Anesthesiologist and Surgeon  Anesthesia Plan Comments:         Anesthesia Quick Evaluation
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
## 2012-10-27 NOTE — ED Notes (Signed)
Pt taken to CT 2 with this RN and MD.
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
## 2012-10-27 NOTE — Anesthesia Postprocedure Evaluation (Signed)
  Anesthesia Post-op Note  Patient: Jillian Hernandez  Procedure(s) Performed: Procedure(s): COMPLETION AMPUTATION LEFT ARM (Left)  Patient Location: PACU  Anesthesia Type:General  Level of Consciousness: awake, alert  and oriented  Airway and Oxygen Therapy: Patient Spontanous Breathing and Patient connected to nasal cannula oxygen  Post-op Pain: mild  Post-op Assessment: Post-op Vital signs reviewed, Patient's Cardiovascular Status Stable, Respiratory Function Stable and Pain level controlled  Post-op Vital Signs: stable  Complications: No apparent anesthesia complications

## 2012-10-27 NOTE — ED Notes (Signed)
Pressure dressing applied with saline gauze to left arm amputation.

## 2012-10-27 NOTE — ED Notes (Signed)
Pt taken to OR from CT.
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
## 2012-10-27 NOTE — Transfer of Care (Signed)
Immediate Anesthesia Transfer of Care Note  Patient: Jillian Hernandez  Procedure(s) Performed: Procedure(s): COMPLETION AMPUTATION LEFT ARM (Left)  Patient Location: PACU  Anesthesia Type:GA combined with regional for post-op pain  Level of Consciousness: awake, alert  and oriented  Airway & Oxygen Therapy: Patient Spontanous Breathing and Patient connected to nasal cannula oxygen  Post-op Assessment: Report given to PACU RN, Post -op Vital signs reviewed and stable and Patient moving all extremities  Post vital signs: Reviewed and stable  Complications: No apparent anesthesia complications

## 2012-10-27 NOTE — Progress Notes (Signed)
Chaplain Note:  Chaplain responded immediately to LV1 trauma page.  When chaplain arrived, pt was already in trauma bay B, being treated by ED staff.  Chaplain met with pt's family and CSW in conference room B, providing spiritual comfort and support.  Chaplain remained with family, escorting new arrivals to ED conference rooms and providing spiritual comfort and support while pt's physicians explained pt's condition and their immediate plan of care.  When pt was taken to surgery, chaplain escorted pt's family to surgery waiting area.  Pt's family expressed appreciation for chaplain support.  Chaplain will follow up as needed.  10/27/12 1000  Clinical Encounter Type  Visited With Patient;Family  Visit Type Spiritual support;Trauma  Referral From Other (Comment) (Trauma Page)  Spiritual Encounters  Spiritual Needs Emotional  Stress Factors  Patient Stress Factors Health changes;Loss;Major life changes  Family Stress Factors Lack of knowledge;Major life changes  Verdie Shire, Chaplain [phone]

## 2012-10-28 LAB — COMPREHENSIVE METABOLIC PANEL
ALT: 15 U/L (ref 0–35)
AST: 25 U/L (ref 0–37)
Albumin: 3 g/dL — ABNORMAL LOW (ref 3.5–5.2)
Alkaline Phosphatase: 37 U/L — ABNORMAL LOW (ref 39–117)
BUN: 5 mg/dL — ABNORMAL LOW (ref 6–23)
CO2: 24 mEq/L (ref 19–32)
Calcium: 7.7 mg/dL — ABNORMAL LOW (ref 8.4–10.5)
Chloride: 103 mEq/L (ref 96–112)
Creatinine, Ser: 0.83 mg/dL (ref 0.50–1.10)
GFR calc Af Amer: >90 mL/min (ref >90)
GFR calc non Af Amer: >90 mL/min (ref >90)
Glucose, Bld: 143 mg/dL — ABNORMAL HIGH (ref 70–99)
Potassium: 3.9 mEq/L (ref 3.5–5.1)
Sodium: 134 mEq/L — ABNORMAL LOW (ref 135–145)
Total Bilirubin: 0.4 mg/dL (ref 0.3–1.2)
Total Protein: 5.8 g/dL — ABNORMAL LOW (ref 6.0–8.3)

## 2012-10-28 LAB — CBC
HCT: 27 % — ABNORMAL LOW (ref 36.0–46.0)
Hemoglobin: 9.5 g/dL — ABNORMAL LOW (ref 12.0–15.0)
MCH: 32.3 pg (ref 26.0–34.0)
MCHC: 35.2 g/dL (ref 30.0–36.0)
MCV: 91.8 fL (ref 78.0–100.0)
Platelets: 202 10*3/uL (ref 150–400)
RBC: 2.94 MIL/uL — ABNORMAL LOW (ref 3.87–5.11)
RDW: 12.7 % (ref 11.5–15.5)
WBC: 7.9 10*3/uL (ref 4.0–10.5)

## 2012-10-28 MED ORDER — BACITRACIN-NEOMYCIN-POLYMYXIN OINTMENT TUBE
TOPICAL_OINTMENT | Freq: Two times a day (BID) | CUTANEOUS | Status: DC
  Administered 2012-10-28 – 2012-11-04 (×14): via TOPICAL
  Filled 2012-10-28: qty 15
  Filled 2012-10-28: qty 1
  Filled 2012-10-28: qty 15

## 2012-10-28 MED ORDER — KETOROLAC TROMETHAMINE 30 MG/ML IJ SOLN
30.0000 mg | Freq: Four times a day (QID) | INTRAMUSCULAR | Status: AC
  Administered 2012-10-28 – 2012-10-30 (×8): 30 mg via INTRAVENOUS
  Filled 2012-10-28 (×9): qty 1

## 2012-10-28 NOTE — Progress Notes (Signed)
Pt with increased drainage noted on dsg to LUA also requiring gown and pad to be changed x2 in last hr. TC to MD B/P remains stable at 124/72 HR remains in 90's. Informed do not remove dsg reinforce current dsg with ABD and kerlix. Pt tol procedure well. Will cont. To monitor
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
## 2012-10-28 NOTE — Progress Notes (Signed)
Plan or am for I and D  revision amputation possible synthetic skin graft

## 2012-10-28 NOTE — Progress Notes (Signed)
Patient has new drainage noted on dressing also complaining of dry cough.Ice pack applied to left shoulder and drainage slowed down.Dr.Wyatt notified .New order received for robitussin .
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
## 2012-10-29 ENCOUNTER — Encounter (HOSPITAL_COMMUNITY): Admission: EM | Disposition: A | Discharge status: Home / Self Care | Payer: Self-pay | Source: Home / Self Care

## 2012-10-29 ENCOUNTER — Encounter (HOSPITAL_COMMUNITY): Payer: Self-pay | Admitting: Anesthesiology

## 2012-10-29 ENCOUNTER — Inpatient Hospital Stay (HOSPITAL_COMMUNITY): Payer: Medicaid Other | Admitting: Anesthesiology

## 2012-10-29 DIAGNOSIS — S48112A Complete traumatic amputation at level between left shoulder and elbow, initial encounter: Secondary | ICD-10-CM | POA: Diagnosis present

## 2012-10-29 DIAGNOSIS — S27899A Unspecified injury of other specified intrathoracic organs, initial encounter: Secondary | ICD-10-CM

## 2012-10-29 HISTORY — PX: APPLICATION OF WOUND VAC: SHX5189

## 2012-10-29 HISTORY — PX: AMPUTATION: SHX166

## 2012-10-29 HISTORY — PX: I&D EXTREMITY: SHX5045

## 2012-10-29 LAB — COMPREHENSIVE METABOLIC PANEL
ALT: 14 U/L (ref 0–35)
AST: 23 U/L (ref 0–37)
Albumin: 2.6 g/dL — ABNORMAL LOW (ref 3.5–5.2)
CO2: 25 mEq/L (ref 19–32)
Calcium: 7.9 mg/dL — ABNORMAL LOW (ref 8.4–10.5)
GFR calc non Af Amer: 74 mL/min — ABNORMAL LOW (ref >90)
Sodium: 136 mEq/L (ref 135–145)
Total Protein: 5.5 g/dL — ABNORMAL LOW (ref 6.0–8.3)

## 2012-10-29 LAB — CBC
MCH: 32.4 pg (ref 26.0–34.0)
Platelets: 185 10*3/uL (ref 150–400)
RBC: 2.53 MIL/uL — ABNORMAL LOW (ref 3.87–5.11)
RDW: 12.8 % (ref 11.5–15.5)

## 2012-10-29 SURGERY — AMPUTATION BELOW KNEE
Anesthesia: General | Site: Arm Upper | Laterality: Left | Wound class: Dirty or Infected

## 2012-10-29 MED ORDER — ACETAMINOPHEN 10 MG/ML IV SOLN
1000.0000 mg | Freq: Once | INTRAVENOUS | Status: DC | PRN
  Filled 2012-10-29: qty 100

## 2012-10-29 MED ORDER — HYDROMORPHONE HCL PF 1 MG/ML IJ SOLN
0.2500 mg | INTRAMUSCULAR | Status: DC | PRN
  Administered 2012-10-29 (×5): 0.5 mg via INTRAVENOUS
  Filled 2012-10-29: qty 1

## 2012-10-29 MED ORDER — ONDANSETRON HCL 4 MG/2ML IJ SOLN
4.0000 mg | Freq: Four times a day (QID) | INTRAMUSCULAR | Status: DC | PRN
  Administered 2012-10-30: 4 mg via INTRAVENOUS
  Filled 2012-10-29 (×2): qty 2

## 2012-10-29 MED ORDER — METOCLOPRAMIDE HCL 10 MG PO TABS
5.0000 mg | ORAL_TABLET | Freq: Three times a day (TID) | ORAL | Status: DC | PRN
  Administered 2012-10-30: 10 mg via ORAL
  Filled 2012-10-29 (×2): qty 1

## 2012-10-29 MED ORDER — PROPOFOL 10 MG/ML IV BOLUS
INTRAVENOUS | Status: DC | PRN
  Administered 2012-10-29: 40 mg via INTRAVENOUS
  Administered 2012-10-29: 160 mg via INTRAVENOUS

## 2012-10-29 MED ORDER — OXYCODONE HCL 5 MG PO TABS: 5.0000 mg | ORAL_TABLET | ORAL | Status: DC | PRN

## 2012-10-29 MED ORDER — FENTANYL CITRATE 0.05 MG/ML IJ SOLN
INTRAMUSCULAR | Status: DC | PRN
  Administered 2012-10-29 (×10): 50 ug via INTRAVENOUS
  Administered 2012-10-29: 100 ug via INTRAVENOUS
  Administered 2012-10-29: 50 ug via INTRAVENOUS
  Administered 2012-10-29: 100 ug via INTRAVENOUS

## 2012-10-29 MED ORDER — LACTATED RINGERS IV SOLN
INTRAVENOUS | Status: DC | PRN
  Administered 2012-10-29: 08:00:00 via INTRAVENOUS

## 2012-10-29 MED ORDER — HYDROMORPHONE HCL PF 1 MG/ML IJ SOLN
INTRAMUSCULAR | Status: AC
  Filled 2012-10-29: qty 1

## 2012-10-29 MED ORDER — LIDOCAINE HCL (CARDIAC) 20 MG/ML IV SOLN
INTRAVENOUS | Status: DC | PRN
  Administered 2012-10-29: 60 mg via INTRAVENOUS

## 2012-10-29 MED ORDER — ONDANSETRON HCL 4 MG/2ML IJ SOLN
INTRAMUSCULAR | Status: DC | PRN
  Administered 2012-10-29: 4 mg via INTRAVENOUS

## 2012-10-29 MED ORDER — GLYCOPYRROLATE 0.2 MG/ML IJ SOLN
INTRAMUSCULAR | Status: DC | PRN
  Administered 2012-10-29: 0.2 mg via INTRAVENOUS

## 2012-10-29 MED ORDER — HYDROMORPHONE HCL PF 1 MG/ML IJ SOLN: 1.0000 mg | Freq: Once | INTRAMUSCULAR | Status: DC

## 2012-10-29 MED ORDER — 0.9 % SODIUM CHLORIDE (POUR BTL) OPTIME
TOPICAL | Status: DC | PRN
  Administered 2012-10-29: 1000 mL

## 2012-10-29 MED ORDER — ONDANSETRON HCL 4 MG/2ML IJ SOLN: 4.0000 mg | Freq: Once | INTRAMUSCULAR | Status: DC | PRN

## 2012-10-29 MED ORDER — SODIUM CHLORIDE 0.9 % IR SOLN
Status: DC | PRN
  Administered 2012-10-29: 3000 mL

## 2012-10-29 MED ORDER — METOCLOPRAMIDE HCL 5 MG/ML IJ SOLN
5.0000 mg | Freq: Three times a day (TID) | INTRAMUSCULAR | Status: DC | PRN
  Administered 2012-10-29: 10 mg via INTRAVENOUS
  Filled 2012-10-29: qty 2

## 2012-10-29 MED ORDER — ALTEPLASE 2 MG IJ SOLR
2.0000 mg | Freq: Once | INTRAMUSCULAR | Status: AC
  Administered 2012-10-29: 2 mg
  Filled 2012-10-29: qty 2

## 2012-10-29 MED ORDER — ONDANSETRON HCL 4 MG PO TABS
4.0000 mg | ORAL_TABLET | Freq: Four times a day (QID) | ORAL | Status: DC | PRN
  Administered 2012-10-31: 4 mg via ORAL
  Filled 2012-10-29: qty 1

## 2012-10-29 MED ORDER — MIDAZOLAM HCL 5 MG/5ML IJ SOLN
INTRAMUSCULAR | Status: DC | PRN
  Administered 2012-10-29: 2 mg via INTRAVENOUS

## 2012-10-29 MED ORDER — MIDAZOLAM HCL 2 MG/2ML IJ SOLN
INTRAMUSCULAR | Status: AC
  Filled 2012-10-29: qty 2

## 2012-10-29 MED ORDER — NEOSTIGMINE METHYLSULFATE 1 MG/ML IJ SOLN
INTRAMUSCULAR | Status: DC | PRN
  Administered 2012-10-29: 2 mg via INTRAVENOUS

## 2012-10-29 MED ORDER — SODIUM CHLORIDE 0.9 % IJ SOLN
10.0000 mL | INTRAMUSCULAR | Status: DC | PRN
  Administered 2012-10-29 – 2012-10-30 (×4): 10 mL

## 2012-10-29 MED ORDER — MIDAZOLAM HCL 2 MG/2ML IJ SOLN
2.0000 mg | Freq: Once | INTRAMUSCULAR | Status: AC
  Administered 2012-10-29: 1 mg via INTRAVENOUS

## 2012-10-29 MED ORDER — ROCURONIUM BROMIDE 100 MG/10ML IV SOLN
INTRAVENOUS | Status: DC | PRN
  Administered 2012-10-29: 40 mg via INTRAVENOUS

## 2012-10-29 SURGICAL SUPPLY — 63 items
BLADE SAW SGTL 83.5X18.5 (BLADE) ×3 IMPLANT
BLADE SURG 10 STRL SS (BLADE) ×2 IMPLANT
BNDG COHESIVE 6X5 TAN STRL LF (GAUZE/BANDAGES/DRESSINGS) ×2 IMPLANT
CLOTH BEACON ORANGE TIMEOUT ST (SAFETY) ×3 IMPLANT
COVER SURGICAL LIGHT HANDLE (MISCELLANEOUS) ×3 IMPLANT
CUFF TOURNIQUET SINGLE 34IN LL (TOURNIQUET CUFF) IMPLANT
CUFF TOURNIQUET SINGLE 44IN (TOURNIQUET CUFF) IMPLANT
DRAIN PENROSE 1/2X12 LTX STRL (WOUND CARE) IMPLANT
DRAPE EXTREMITY T 121X128X90 (DRAPE) ×3 IMPLANT
DRAPE INCISE IOBAN 66X45 STRL (DRAPES) ×1 IMPLANT
DRAPE ORTHO SPLIT 77X108 STRL (DRAPES) ×6
DRAPE PROXIMA HALF (DRAPES) ×5 IMPLANT
DRAPE SURG ORHT 6 SPLT 77X108 (DRAPES) IMPLANT
DRAPE U-SHAPE 47X51 STRL (DRAPES) ×6 IMPLANT
DRSG MEPITEL 4X7.2 (GAUZE/BANDAGES/DRESSINGS) ×1 IMPLANT
DRSG PAD ABDOMINAL 8X10 ST (GAUZE/BANDAGES/DRESSINGS) ×1 IMPLANT
DRSG VAC ATS SM SENSATRAC (GAUZE/BANDAGES/DRESSINGS) ×2 IMPLANT
DURAPREP 26ML APPLICATOR (WOUND CARE) ×1 IMPLANT
ELECT CAUTERY BLADE 6.4 (BLADE) ×1 IMPLANT
EVACUATOR 1/8 PVC DRAIN (DRAIN) IMPLANT
GLOVE BIO SURGEON STRL SZ7 (GLOVE) ×1 IMPLANT
GLOVE BIOGEL PI IND STRL 6.5 (GLOVE) ×1 IMPLANT
GLOVE BIOGEL PI IND STRL 7.0 (GLOVE) IMPLANT
GLOVE BIOGEL PI IND STRL 9 (GLOVE) IMPLANT
GLOVE BIOGEL PI INDICATOR 6.5 (GLOVE) ×1
GLOVE BIOGEL PI INDICATOR 7.0 (GLOVE) ×1
GLOVE BIOGEL PI INDICATOR 9 (GLOVE) ×1
GLOVE SURG ORTHO 8.0 STRL STRW (GLOVE) ×3 IMPLANT
GLOVE SURG ORTHO 8.5 STRL (GLOVE) ×1 IMPLANT
GLOVE SURG SS PI 6.5 STRL IVOR (GLOVE) ×1 IMPLANT
GOWN PREVENTION PLUS LG XLONG (DISPOSABLE) ×1 IMPLANT
GOWN PREVENTION PLUS XLARGE (GOWN DISPOSABLE) ×3 IMPLANT
GOWN STRL NON-REIN LRG LVL3 (GOWN DISPOSABLE) ×3 IMPLANT
GRAFT TISS THERASKIN 2X3 (Tissue) IMPLANT
HANDPIECE INTERPULSE COAX TIP (DISPOSABLE) ×3
KIT DRAINAGE VACCUM ASSIST (KITS) ×2 IMPLANT
KIT ROOM TURNOVER OR (KITS) ×3 IMPLANT
MANIFOLD NEPTUNE II (INSTRUMENTS) ×3 IMPLANT
NS IRRIG 1000ML POUR BTL (IV SOLUTION) ×3 IMPLANT
PACK GENERAL/GYN (CUSTOM PROCEDURE TRAY) ×3 IMPLANT
PAD ARMBOARD 7.5X6 YLW CONV (MISCELLANEOUS) ×6 IMPLANT
PAD NEG PRESSURE SENSATRAC (MISCELLANEOUS) ×2 IMPLANT
SET HNDPC FAN SPRY TIP SCT (DISPOSABLE) IMPLANT
SPONGE LAP 18X18 X RAY DECT (DISPOSABLE) ×2 IMPLANT
STAPLER VISISTAT 35W (STAPLE) IMPLANT
STOCKINETTE IMPERVIOUS LG (DRAPES) ×2 IMPLANT
SUT ETHILON 2 0 PSLX (SUTURE) ×2 IMPLANT
SUT PDS AB 1 CT  36 (SUTURE)
SUT PDS AB 1 CT 36 (SUTURE) IMPLANT
SUT SILK 2 0 (SUTURE) ×6
SUT SILK 2 0 SH CR/8 (SUTURE) ×2 IMPLANT
SUT SILK 2-0 18XBRD TIE 12 (SUTURE) ×2 IMPLANT
SUT VIC AB 0 CT1 27 (SUTURE)
SUT VIC AB 0 CT1 27XBRD ANBCTR (SUTURE) ×1 IMPLANT
SUT VIC AB 2-0 CT1 27 (SUTURE) ×6
SUT VIC AB 2-0 CT1 TAPERPNT 27 (SUTURE) ×4 IMPLANT
SWAB COLLECTION DEVICE MRSA (MISCELLANEOUS) IMPLANT
TAPE CLOTH SURG 6X10 WHT LF (GAUZE/BANDAGES/DRESSINGS) ×1 IMPLANT
TISSUE THERASKIN 2X3 (Tissue) ×6 IMPLANT
TOWEL OR 17X24 6PK STRL BLUE (TOWEL DISPOSABLE) ×3 IMPLANT
TOWEL OR 17X26 10 PK STRL BLUE (TOWEL DISPOSABLE) ×3 IMPLANT
TUBE ANAEROBIC SPECIMEN COL (MISCELLANEOUS) IMPLANT
WATER STERILE IRR 1000ML POUR (IV SOLUTION) ×3 IMPLANT

## 2012-10-29 NOTE — Anesthesia Postprocedure Evaluation (Signed)
  Anesthesia Post-op Note  Patient: Jillian Hernandez  Procedure(s) Performed: Procedure(s): REVISION AMPUTATION ARM (Left) IRRIGATION AND DEBRIDEMENT EXTREMITY (Left) APPLICATION OF WOUND VAC (Left)  Patient Location: PACU  Anesthesia Type:General  Level of Consciousness: awake, alert  and oriented  Airway and Oxygen Therapy: Patient Spontanous Breathing  Post-op Pain: mild  Post-op Assessment: Post-op Vital signs reviewed, Patient's Cardiovascular Status Stable, Respiratory Function Stable, Patent Airway and Pain level controlled  Post-op Vital Signs: stable  Complications: No apparent anesthesia complications

## 2012-10-29 NOTE — Progress Notes (Signed)
Patient ID: Jillian Hernandez, female   DOB: 1980-05-03, 32 y.o.   MRN: [id number] Day of Surgery  Subjective: Severe pain, PCA was not restarted quickly and pain became uncontrolled, playing catch up now, tachycardiac,   Objective: Vital signs in last 24 hours: Temp:  [98.7 F (37.1 C)-99.5 F (37.5 C)] 98.9 F (37.2 C) (07/06 1054) Pulse Rate:  [97-164] 164 (07/06 1054) Resp:  [10-18] 16 (07/06 1139) BP: (107-149)/(62-83) 130/68 mmHg (07/06 1054) SpO2:  [95 %-100 %] 100 % (07/06 1139) Last BM Date: 10/27/12  Intake/Output from previous day: 07/05 0701 - 07/06 0700 In: 3309.2 [P.O.:600; I.V.:1609.2; IV Piggyback:1100] Out: 750 [Urine:750] Intake/Output this shift: Total I/O In: 600 [I.V.:600] Out: 75 [Blood:75]  General appearance: cooperative, in pain at first, then became drowsy Neck: Superficial abrasion left neck - minimal drainage Left arm dressing - wound vac in place  Lab Results:   Recent Labs  10/28/12 0420 10/29/12 0530  WBC 7.9 5.6  HGB 9.5* 8.2*  HCT 27.0* 23.4*  PLT 202 185   BMET  Recent Labs  10/28/12 0420 10/29/12 0530  NA 134* 136  K 3.9 3.9  CL 103 107  CO2 24 25  GLUCOSE 143* 95  BUN 5* 6  CREATININE 0.83 1.00  CALCIUM 7.7* 7.9*   PT/INR  Recent Labs  10/27/12 0915  LABPROT 14.3  INR 1.13   ABG No results found for this basename: PHART, PCO2, PO2, HCO3,  in the last 72 hours  Studies/Results: No results found.  Anti-infectives: Anti-infectives   Start     Dose/Rate Route Frequency Ordered Stop   10/27/12 2200  vancomycin (VANCOCIN) 1,500 mg in sodium chloride 0.9 % 500 mL IVPB     1,500 mg 250 mL/hr over 120 Minutes Intravenous Every 12 hours 10/27/12 0949     10/27/12 1900  piperacillin-tazobactam (ZOSYN) IVPB 3.375 g     3.375 g 12.5 mL/hr over 240 Minutes Intravenous 3 times per day 10/27/12 1438     10/27/12 1400  piperacillin-tazobactam (ZOSYN) IVPB 3.375 g  Status:  Discontinued     3.375 g 12.5 mL/hr over 240  Minutes Intravenous 3 times per day 10/27/12 0949 10/27/12 1438   10/27/12 1030  piperacillin-tazobactam (ZOSYN) IVPB 3.375 g     3.375 g 12.5 mL/hr over 240 Minutes Intravenous To Surgery 10/27/12 1022 10/27/12 1817   10/27/12 1000  [MAR Hold]  vancomycin (VANCOCIN) 1,500 mg in sodium chloride 0.9 % 500 mL IVPB     (On MAR Hold since 10/27/12 1019)   1,500 mg 250 mL/hr over 120 Minutes Intravenous To Emergency Dept 10/27/12 0949 10/27/12 1015   10/27/12 1000  piperacillin-tazobactam (ZOSYN) IVPB 3.375 g     3.375 g 100 mL/hr over 30 Minutes Intravenous NOW 10/27/12 0949 10/27/12 1030   10/27/12 0948  ceFAZolin (ANCEF) IVPB 2 g/50 mL premix  Status:  Discontinued     2 g 100 mL/hr over 30 Minutes Intravenous  Once 10/27/12 0948 10/27/12 1355      Assessment/Plan: s/p Procedure(s): REVISION AMPUTATION ARM (Left) IRRIGATION AND DEBRIDEMENT EXTREMITY (Left) APPLICATION OF WOUND VAC (Left)  ?Add oxycodone to help with longer pain control ?[employer] consult Tachycardiac: ?pain related, monitor closely Physical therapy/ mobilize patient OOB Monitor Hgb - no transfusion today.   LOS: 2 days    Hernandez, Jillian 10/29/2012

## 2012-10-29 NOTE — Brief Op Note (Signed)
10/27/2012 - 10/29/2012  9:26 AM  PATIENT:  Marvetta Gibbons  32 y.o. female  PRE-OPERATIVE DIAGNOSIS:  Status post Traumatic left arm amputation  POST-OPERATIVE DIAGNOSIS:  Traumatic left arm amputation  PROCEDURE:  Procedure(s): REVISION AMPUTATION ARM IRRIGATION AND DEBRIDEMENT EXTREMITY APPLICATION OF WOUND VAC Delayed primary closure SURGEON:  S Bruce Churilla ASSISTANT: Jonathon Bellows  ANESTHESIA:   general  EBL: 150 ml    Total I/O In: 600 [I.V.:600] Out: 75 [Blood:75]  BLOOD ADMINISTERED: none  DRAINS: none   LOCAL MEDICATIONS USED:  none  SPECIMEN:  No Specimen  COUNTS:  YES  TOURNIQUET:  * No tourniquets in log *  DICTATION: .Other Dictation: Dictation Number [id number]  PLAN OF CARE: Admit to inpatient   PATIENT DISPOSITION:  PACU - hemodynamically stable

## 2012-10-29 NOTE — Anesthesia Procedure Notes (Signed)
Procedure Name: Intubation Date/Time: 10/29/2012 7:46 AM Performed by: Alanda Amass A Pre-anesthesia Checklist: Patient identified, Emergency Drugs available, Timeout performed, Suction available and Patient being monitored Patient Re-evaluated:Patient Re-evaluated prior to inductionOxygen Delivery Method: Circle system utilized Preoxygenation: Pre-oxygenation with 100% oxygen Intubation Type: IV induction Ventilation: Mask ventilation without difficulty Laryngoscope Size: Mac and 3 Grade View: Grade I Tube type: Oral Tube size: 7.5 mm Number of attempts: 1 Airway Equipment and Method: Stylet Placement Confirmation: ETT inserted through vocal cords under direct vision,  breath sounds checked- equal and bilateral and positive ETCO2 Secured at: 21 cm Tube secured with: Tape Dental Injury: Teeth and Oropharynx as per pre-operative assessment

## 2012-10-29 NOTE — Anesthesia Preprocedure Evaluation (Addendum)
Anesthesia Evaluation  Patient identified by MRN, date of birth, ID band Patient awake    Reviewed: Allergy & Precautions, H&P , NPO status , Patient's Chart, lab work & pertinent test results  Airway Mallampati: II TM Distance: >3 FB Neck ROM: Full    Dental  (+) Teeth Intact and Dental Advisory Given   Pulmonary          Cardiovascular     Neuro/Psych    GI/Hepatic   Endo/Other    Renal/GU      Musculoskeletal   Abdominal   Peds  Hematology   Anesthesia Other Findings S/p completion traumatic amputation lt arm  Reproductive/Obstetrics                          Anesthesia Physical Anesthesia Plan  ASA: II  Anesthesia Plan: General   Post-op Pain Management:    Induction: Intravenous  Airway Management Planned: Oral ETT  Additional Equipment:   Intra-op Plan:   Post-operative Plan: Extubation in OR  Informed Consent: I have reviewed the patients History and Physical, chart, labs and discussed the procedure including the risks, benefits and alternatives for the proposed anesthesia with the patient or authorized representative who has indicated his/her understanding and acceptance.   Dental advisory given  Plan Discussed with:   Anesthesia Plan Comments:         Anesthesia Quick Evaluation
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
## 2012-10-29 NOTE — Transfer of Care (Signed)
Immediate Anesthesia Transfer of Care Note  Patient: Jillian Hernandez  Procedure(s) Performed: Procedure(s): REVISION AMPUTATION ARM (Left) IRRIGATION AND DEBRIDEMENT EXTREMITY (Left) APPLICATION OF WOUND VAC (Left)  Patient Location: PACU  Anesthesia Type:General  Level of Consciousness: awake  Airway & Oxygen Therapy: Patient Spontanous Breathing  Post-op Assessment: Report given to PACU RN and Post -op Vital signs reviewed and stable  Post vital signs: Reviewed and stable  Complications: No apparent anesthesia complications
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
## 2012-10-29 NOTE — Progress Notes (Signed)
Pain likely cause of tachycardia.  Marta Lamas. Gae Bon, MD, FACS [phone] Trauma Surgeon
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
## 2012-10-30 ENCOUNTER — Encounter (HOSPITAL_COMMUNITY): Payer: Self-pay | Admitting: Orthopedic Surgery

## 2012-10-30 DIAGNOSIS — S1091XA Abrasion of unspecified part of neck, initial encounter: Secondary | ICD-10-CM

## 2012-10-30 DIAGNOSIS — D62 Acute posthemorrhagic anemia: Secondary | ICD-10-CM | POA: Diagnosis present

## 2012-10-30 LAB — PREPARE RBC (CROSSMATCH)

## 2012-10-30 LAB — CBC WITH DIFFERENTIAL/PLATELET
Eosinophils Relative: 6 % — ABNORMAL HIGH (ref 0–5)
HCT: 18.4 % — ABNORMAL LOW (ref 36.0–46.0)
Lymphs Abs: 0.9 10*3/uL (ref 0.7–4.0)
MCH: 31.8 pg (ref 26.0–34.0)
MCV: 91.5 fL (ref 78.0–100.0)
Monocytes Absolute: 0.7 10*3/uL (ref 0.1–1.0)
Monocytes Relative: 10 % (ref 3–12)
Neutro Abs: 4.6 10*3/uL (ref 1.7–7.7)
Platelets: 175 10*3/uL (ref 150–400)
RBC: 2.01 MIL/uL — ABNORMAL LOW (ref 3.87–5.11)
RDW: 12.6 % (ref 11.5–15.5)

## 2012-10-30 MED ORDER — HYDROMORPHONE HCL PF 1 MG/ML IJ SOLN
1.0000 mg | INTRAMUSCULAR | Status: DC | PRN
  Administered 2012-10-30 – 2012-10-31 (×5): 1 mg via INTRAVENOUS
  Filled 2012-10-30 (×5): qty 1

## 2012-10-30 MED ORDER — PREGABALIN 50 MG PO CAPS
75.0000 mg | ORAL_CAPSULE | Freq: Three times a day (TID) | ORAL | Status: DC | PRN
  Administered 2012-10-30: 75 mg via ORAL
  Filled 2012-10-30: qty 1

## 2012-10-30 MED ORDER — PANTOPRAZOLE SODIUM 40 MG PO TBEC
40.0000 mg | DELAYED_RELEASE_TABLET | Freq: Every day | ORAL | Status: DC
  Administered 2012-10-30 – 2012-10-31 (×2): 40 mg via ORAL
  Filled 2012-10-30 (×2): qty 1

## 2012-10-30 MED ORDER — OXYCODONE HCL 5 MG PO TABS
10.0000 mg | ORAL_TABLET | ORAL | Status: DC | PRN
  Administered 2012-10-30: 10 mg via ORAL
  Administered 2012-10-31 (×2): 20 mg via ORAL
  Administered 2012-10-31 (×2): 10 mg via ORAL
  Administered 2012-10-31: 15 mg via ORAL
  Administered 2012-11-01 (×2): 20 mg via ORAL
  Filled 2012-10-30 (×3): qty 4
  Filled 2012-10-30: qty 2
  Filled 2012-10-30 (×3): qty 4
  Filled 2012-10-30: qty 2

## 2012-10-30 MED ORDER — TRAMADOL HCL 50 MG PO TABS
100.0000 mg | ORAL_TABLET | Freq: Four times a day (QID) | ORAL | Status: DC
  Administered 2012-10-30 – 2012-11-01 (×5): 100 mg via ORAL
  Filled 2012-10-30 (×4): qty 2

## 2012-10-30 MED ORDER — METHOCARBAMOL 750 MG PO TABS
1500.0000 mg | ORAL_TABLET | Freq: Four times a day (QID) | ORAL | Status: DC
  Administered 2012-10-30 – 2012-10-31 (×5): 1500 mg via ORAL
  Filled 2012-10-30 (×9): qty 2

## 2012-10-30 MED ORDER — METHOCARBAMOL 500 MG PO TABS: 500.0000 mg | ORAL_TABLET | Freq: Three times a day (TID) | ORAL | Status: DC
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
## 2012-10-30 NOTE — Progress Notes (Signed)
UR completed 

## 2012-10-30 NOTE — Progress Notes (Signed)
Good spirits.  Having phantom limb pain so will try lyrica.  TF 1u PRBC.  I spoke to her sister as well. Patient examined and I agree with the assessment and plan  Violeta Gelinas, MD, MPH, FACS Pager: [phone]  10/30/2012 11:05 AM
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
## 2012-10-30 NOTE — Progress Notes (Signed)
Pt stable - spasms present Dressing dry - wound vac in place hgb low - tx done Will add robaxin

## 2012-10-31 ENCOUNTER — Encounter (HOSPITAL_COMMUNITY): Payer: Self-pay | Admitting: Orthopedic Surgery

## 2012-10-31 LAB — VANCOMYCIN, TROUGH: Vancomycin Tr: 21.8 ug/mL — ABNORMAL HIGH (ref 10.0–20.0)

## 2012-10-31 LAB — CBC
Hemoglobin: 8.9 g/dL — ABNORMAL LOW (ref 12.0–15.0)
RBC: 2.9 MIL/uL — ABNORMAL LOW (ref 3.87–5.11)
WBC: 6.7 10*3/uL (ref 4.0–10.5)

## 2012-10-31 LAB — TYPE AND SCREEN
ABO/RH(D): A POS
Unit division: 0
Unit division: 0
Unit division: 0

## 2012-10-31 LAB — POCT I-STAT 4, (NA,K, GLUC, HGB,HCT)
Glucose, Bld: 171 mg/dL — ABNORMAL HIGH (ref 70–99)
HCT: 31 % — ABNORMAL LOW (ref 36.0–46.0)
Sodium: 139 meq/L (ref 135–145)

## 2012-10-31 LAB — COMPREHENSIVE METABOLIC PANEL
Albumin: 2.4 g/dL — ABNORMAL LOW (ref 3.5–5.2)
BUN: 6 mg/dL (ref 6–23)
Creatinine, Ser: 1.74 mg/dL — ABNORMAL HIGH (ref 0.50–1.10)
GFR calc Af Amer: 44 mL/min — ABNORMAL LOW (ref >90)
Total Bilirubin: 0.4 mg/dL (ref 0.3–1.2)
Total Protein: 5.4 g/dL — ABNORMAL LOW (ref 6.0–8.3)

## 2012-10-31 MED ORDER — POLYETHYLENE GLYCOL 3350 17 G PO PACK
17.0000 g | PACK | Freq: Every day | ORAL | Status: DC
  Administered 2012-10-31 – 2012-11-04 (×4): 17 g via ORAL
  Filled 2012-10-31 (×6): qty 1

## 2012-10-31 MED ORDER — DIAZEPAM 5 MG PO TABS
5.0000 mg | ORAL_TABLET | Freq: Every day | ORAL | Status: DC
  Administered 2012-10-31 – 2012-11-03 (×3): 5 mg via ORAL
  Filled 2012-10-31 (×3): qty 1

## 2012-10-31 MED ORDER — DIAZEPAM 5 MG PO TABS: 5.0000 mg | ORAL_TABLET | Freq: Every day | ORAL | Status: DC

## 2012-10-31 MED ORDER — HYDROMORPHONE HCL PF 1 MG/ML IJ SOLN: 0.5000 mg | INTRAMUSCULAR | Status: DC | PRN

## 2012-10-31 MED ORDER — ACETAMINOPHEN 10 MG/ML IV SOLN
1000.0000 mg | Freq: Four times a day (QID) | INTRAVENOUS | Status: DC
  Filled 2012-10-31 (×4): qty 100

## 2012-10-31 MED ORDER — ENOXAPARIN SODIUM 40 MG/0.4ML ~~LOC~~ SOLN
40.0000 mg | SUBCUTANEOUS | Status: DC
  Administered 2012-10-31 – 2012-11-03 (×4): 40 mg via SUBCUTANEOUS
  Filled 2012-10-31 (×5): qty 0.4

## 2012-10-31 MED ORDER — DOCUSATE SODIUM 100 MG PO CAPS
100.0000 mg | ORAL_CAPSULE | Freq: Two times a day (BID) | ORAL | Status: DC
  Administered 2012-10-31 – 2012-11-04 (×7): 100 mg via ORAL
  Filled 2012-10-31 (×10): qty 1

## 2012-10-31 MED ORDER — VANCOMYCIN HCL IN DEXTROSE 1-5 GM/200ML-% IV SOLN
1000.0000 mg | Freq: Two times a day (BID) | INTRAVENOUS | Status: AC
  Administered 2012-10-31 – 2012-11-01 (×3): 1000 mg via INTRAVENOUS
  Filled 2012-10-31 (×3): qty 200

## 2012-10-31 MED ORDER — DIAZEPAM 5 MG PO TABS
5.0000 mg | ORAL_TABLET | Freq: Three times a day (TID) | ORAL | Status: DC | PRN
  Administered 2012-11-01 – 2012-11-04 (×5): 5 mg via ORAL
  Filled 2012-10-31 (×7): qty 1

## 2012-10-31 MED ORDER — HYDROMORPHONE HCL 2 MG PO TABS
4.0000 mg | ORAL_TABLET | ORAL | Status: DC | PRN
  Filled 2012-10-31: qty 2
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
## 2012-10-31 NOTE — Progress Notes (Signed)
Pt sleeping Plan to dc vac am Possible dc later this week if wound ok and pain controlled

## 2012-10-31 NOTE — Progress Notes (Signed)
After pain medicaion dose at, 1353 patient is not nauseated with just the pain medication (15mg  given to patient)
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
## 2012-10-31 NOTE — Progress Notes (Signed)
Clinical Child psychotherapist (CSW) informed that pt sister had questions for CSW. CSW spoke with pt RN to inquire about pt/sister needs. RN informed CSW that pt sister was present in pt room and CSW could speak to pt sister over the phone. CSW greeted pt sister and introduced herself. Sister informed CSW that she was made aware that CSW would complete a disability application with pt. CSW informed sister that CSW department actually does not complete disability applications however a financial counselor could potentially assist with this process. CSW also informed sister that a disability application could be completed at [employer]. Sister stated it was not true that CSW did not complete disability applications in the hospital as sister was informed by financial counseling that someone would complete an application. CSW informed sister that the financial counselor could have referred to someone in their department to assist with this process however pt sister proceeded to state that that was not true and ended conversation with CSW. CSW left a message for financial counseling to determine whether someone would complete a disability application and to inquire what information was given to pt sister.  CSW visited pt room later to complete an SBIRT. CSW greeted pt and sister and requested for assessment to be completed with just pt present. Pt requested for pt sister to remain in the room during assessment. Before CSW could begin assessment pt sister became upset with CSW and informed her that she did not feel that CSW had been professional over the phone. Sister stated "how could you have my sister go apply at [employer] considering her situation." CSW proceeded to reiterate that unfortunately disability applications are not completed with CSW department however CSW had contacted the financial counselor for clarity as to whether someone from their department would assist with this process. Pt  sister continued to become upset with CSW and raised her voice. CSW informed sister that CSW did not appreciate pt sister being rude to CSW and therefore CSW would return at a later time. Sister yelled to speak with CSW supervisor and for this CSW not to return.  CSW staffed case with Asst. Director.   Theresia Bough, MSW, Theresia Majors [phone]
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
## 2012-10-31 NOTE — Progress Notes (Signed)
Resting. Working on adjusting pain medications. Lyrica may have caused N/V yesterday.  VAC F/U by Dr. August Saucer. I spoke to her sister. Patient examined and I agree with the assessment and plan  Violeta Gelinas, MD, MPH, FACS Pager: [phone]  10/31/2012 10:32 AM

## 2012-11-01 DIAGNOSIS — N179 Acute kidney failure, unspecified: Secondary | ICD-10-CM | POA: Diagnosis not present

## 2012-11-01 DIAGNOSIS — R110 Nausea: Secondary | ICD-10-CM

## 2012-11-01 DIAGNOSIS — M7989 Other specified soft tissue disorders: Secondary | ICD-10-CM

## 2012-11-01 LAB — BASIC METABOLIC PANEL
BUN: 6 mg/dL (ref 6–23)
Calcium: 8.2 mg/dL — ABNORMAL LOW (ref 8.4–10.5)
GFR calc non Af Amer: 40 mL/min — ABNORMAL LOW (ref >90)
Glucose, Bld: 132 mg/dL — ABNORMAL HIGH (ref 70–99)
Sodium: 141 mEq/L (ref 135–145)

## 2012-11-01 MED ORDER — LORAZEPAM 2 MG/ML IJ SOLN
1.0000 mg | Freq: Once | INTRAMUSCULAR | Status: AC
  Administered 2012-11-01: 1 mg via INTRAVENOUS
  Filled 2012-11-01: qty 1

## 2012-11-01 MED ORDER — MORPHINE SULFATE ER 15 MG PO TBCR
15.0000 mg | EXTENDED_RELEASE_TABLET | Freq: Two times a day (BID) | ORAL | Status: DC
Start: 2012-11-01 — End: 2012-11-02
  Administered 2012-11-01: 15 mg via ORAL
  Filled 2012-11-01: qty 1

## 2012-11-01 MED ORDER — ONDANSETRON HCL 4 MG/2ML IJ SOLN
4.0000 mg | INTRAMUSCULAR | Status: DC
Start: 2012-11-01 — End: 2012-11-04
  Administered 2012-11-01 – 2012-11-03 (×8): 4 mg via INTRAVENOUS
  Filled 2012-11-01 (×9): qty 2

## 2012-11-01 MED ORDER — ENSURE COMPLETE PO LIQD
237.0000 mL | Freq: Three times a day (TID) | ORAL | Status: DC
  Administered 2012-11-01 – 2012-11-02 (×2): 237 mL via ORAL

## 2012-11-01 MED ORDER — HYDROMORPHONE HCL PF 1 MG/ML IJ SOLN
1.0000 mg | INTRAMUSCULAR | Status: DC | PRN
  Administered 2012-11-01 – 2012-11-02 (×6): 1 mg via INTRAVENOUS
  Filled 2012-11-01 (×6): qty 1

## 2012-11-01 MED ORDER — SILVER SULFADIAZINE 1 % EX CREA
TOPICAL_CREAM | Freq: Every day | CUTANEOUS | Status: DC
  Administered 2012-11-01 – 2012-11-04 (×4): via TOPICAL
  Filled 2012-11-01: qty 85

## 2012-11-01 MED ORDER — ONDANSETRON HCL 4 MG PO TABS
4.0000 mg | ORAL_TABLET | ORAL | Status: DC
  Administered 2012-11-03 – 2012-11-04 (×7): 4 mg via ORAL
  Filled 2012-11-01 (×19): qty 1

## 2012-11-01 NOTE — Progress Notes (Signed)
Subjective: Pt stable - pain controlled   Objective: Vital signs in last 24 hours: Temp:  [97.4 F (36.3 C)-98.4 F (36.9 C)] 98.4 F (36.9 C) (07/09 0553) Pulse Rate:  [82-90] 84 (07/09 0553) Resp:  [16-18] 18 (07/09 0553) BP: (115-130)/(71-85) 115/71 mmHg (07/09 0553) SpO2:  [92 %-96 %] 93 % (07/09 0553)  Intake/Output from previous day: 07/08 0701 - 07/09 0700 In: -  Out: 50 [Drains:50] Intake/Output this shift:    Exam:  wound vac dced - incision ok - small bruise posteriorly but skin edges ok  Labs:  Recent Labs  10/30/12 0200 10/31/12 0500  HGB 6.4* 8.9*    Recent Labs  10/30/12 0200 10/31/12 0500  WBC 6.7 6.7  RBC 2.01* 2.90*  HCT 18.4* 25.4*  PLT 175 222    Recent Labs  10/31/12 1050  NA 140  K 3.7  CL 109  CO2 24  BUN 6  CREATININE 1.74*  GLUCOSE 119*  CALCIUM 8.0*   No results found for this basename: LABPT, INR,  in the last 72 hours  Assessment/Plan: Silvadene and dry dressing to arm qd   Jillian Hernandez 11/01/2012, 7:36 AM

## 2012-11-01 NOTE — Progress Notes (Signed)
VASCULAR LAB PRELIMINARY  PRELIMINARY  PRELIMINARY  PRELIMINARY  Bilateral lower extremity venous duplex completed.    Preliminary report:  Bilateral:  No evidence of DVT, superficial thrombosis, or Baker's Cyst.   Ronnette Rump, RVS 11/01/2012, 11:29 AM

## 2012-11-01 NOTE — Progress Notes (Signed)
Preliminary on doppler is negative.  Patient off the floor.  Will recheck later today.  This patient has been seen and I agree with the findings and treatment plan.  Marta Lamas. Gae Bon, MD, FACS [phone] (pager) [phone] (direct pager) Trauma Surgeon

## 2012-11-01 NOTE — Progress Notes (Signed)
Around 1738 during shift change, pt c/o pain on a level of 6/10. But, in report as I was told that pt had received 20mg  of OXYIR at 1806 and also 1mg  of dilaudid IV at 1856. I then told pt that I was going to see what could be done. I placed a call to the MD on call and spoke with PA- Wynona Neat, who after looking at the situation, decided to order tylenol IV x 3 doses, dilaudid 4mg  Q3 hr, and dilaudid 0.5 - 1mg  for breakthrough pain. I then explained to pt about the new orders to see if that could help better with her pain, but pt's sister refused. I then told her that, it will not be a problem. And I was going to maintain the medications as it previously were. At 2125 pt received valium and refused OXYIR. .2255 at pt's request, 20mg  od OXYIR was given.  A dose of ultram was given as scheduled at 0000, pt vomited around 0200. Emesis looked brown, no evidence of blood and had one pill in there. Pt said she felt better afterwards and did not want any nausea med. At 0208 pt received 1mg  of dilaudid at her request.. She then had a small piece of cake and went back to sleep. 0600 dose of ultram was refused.
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
## 2012-11-02 LAB — CBC
MCH: 30.6 pg (ref 26.0–34.0)
MCHC: 35 g/dL (ref 30.0–36.0)
Platelets: 283 10*3/uL (ref 150–400)
RBC: 3.2 MIL/uL — ABNORMAL LOW (ref 3.87–5.11)
RDW: 14.6 % (ref 11.5–15.5)

## 2012-11-02 LAB — BASIC METABOLIC PANEL
Calcium: 8.5 mg/dL (ref 8.4–10.5)
GFR calc non Af Amer: 37 mL/min — ABNORMAL LOW (ref >90)
Sodium: 141 mEq/L (ref 135–145)

## 2012-11-02 MED ORDER — FENTANYL 50 MCG/HR TD PT72
50.0000 ug | MEDICATED_PATCH | TRANSDERMAL | Status: DC
  Administered 2012-11-02: 50 ug via TRANSDERMAL
  Filled 2012-11-02: qty 1

## 2012-11-02 MED ORDER — HYDROMORPHONE HCL PF 1 MG/ML IJ SOLN
2.0000 mg | INTRAMUSCULAR | Status: DC | PRN
  Administered 2012-11-02: 2 mg via INTRAVENOUS
  Filled 2012-11-02: qty 2

## 2012-11-02 MED ORDER — HYDROMORPHONE HCL PF 1 MG/ML IJ SOLN
1.0000 mg | Freq: Once | INTRAMUSCULAR | Status: AC
  Administered 2012-11-02: 1 mg via INTRAVENOUS
  Filled 2012-11-02: qty 1

## 2012-11-02 MED ORDER — SODIUM CHLORIDE 0.45 % IV SOLN
INTRAVENOUS | Status: DC
  Administered 2012-11-02 (×2): via INTRAVENOUS
  Filled 2012-11-02 (×3): qty 1000

## 2012-11-02 MED ORDER — HYDROMORPHONE HCL PF 1 MG/ML IJ SOLN: 2.0000 mg | INTRAMUSCULAR | Status: DC | PRN

## 2012-11-02 MED ORDER — HYDROMORPHONE HCL 2 MG PO TABS
2.0000 mg | ORAL_TABLET | ORAL | Status: DC | PRN
  Administered 2012-11-03 – 2012-11-04 (×7): 4 mg via ORAL
  Filled 2012-11-02 (×5): qty 2
  Filled 2012-11-02: qty 3
  Filled 2012-11-02: qty 2

## 2012-11-02 MED ORDER — HYDROMORPHONE HCL PF 1 MG/ML IJ SOLN
1.0000 mg | INTRAMUSCULAR | Status: DC | PRN
  Administered 2012-11-02 – 2012-11-03 (×3): 1 mg via INTRAVENOUS
  Filled 2012-11-02 (×5): qty 1

## 2012-11-02 NOTE — Progress Notes (Signed)
AKI likely due to vanc.  More depressed today.  Will allow to go outside with family. Encouraged ambulation.  Still working on pain control, N/V.I spoke to her sister as well. Patient examined and I agree with the assessment and plan  Violeta Gelinas, MD, MPH, FACS Pager: [phone]  11/02/2012 11:00 AM

## 2012-11-02 NOTE — Progress Notes (Signed)
Patient sitting up in bed crying in pain scores 10/10.Asking for pain medication refusing to try prn oral pain medication .   Patient stated,"I don't think I can keep it down."Offered to give patient zofran first .Patient stated,"It"s not going to help."Call placed to Dr.Wyatt.New orders received.Will continue to monitor.

## 2012-11-02 NOTE — Plan of Care (Signed)
Problem: Acute Rehab PT Goals Goal: Pt Will Go Up/Down Stairs Outcome: Progressing Pt to do so with weighted objects next treatment session

## 2012-11-02 NOTE — Progress Notes (Signed)
1532 Up oob ambulating in hall c family member tolerated well to nsg station.
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
## 2012-11-02 NOTE — Progress Notes (Signed)
Patient lying in bed in tears.IV nurse in room to remove central line .Patient asking for medication to numb her leg prior to removal of central line. Offered to give patient valium 5 mg tablet that she had refused earlier.Patient stated that she didn't think she could keep it down.Offered to give patient reglan but she refused.Call placed to Dr.Wyatt .Order received to give 1mg  ativan IV time one dose prior to removal of central line.
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
## 2012-11-02 NOTE — Progress Notes (Signed)
Pt stable - rolled over on stump req iv pain meds Dry dressing to stump daily Will check am Possible dc this weekend depending on pain control
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
## 2012-11-03 LAB — BASIC METABOLIC PANEL
GFR calc Af Amer: 49 mL/min — ABNORMAL LOW (ref >90)
GFR calc non Af Amer: 42 mL/min — ABNORMAL LOW (ref >90)
Potassium: 3.9 mEq/L (ref 3.5–5.1)
Sodium: 139 mEq/L (ref 135–145)

## 2012-11-03 MED ORDER — ONDANSETRON HCL 4 MG PO TABS: 4.0000 mg | ORAL_TABLET | ORAL | Status: DC | PRN

## 2012-11-03 MED ORDER — FENTANYL 50 MCG/HR TD PT72: 1.0000 | MEDICATED_PATCH | TRANSDERMAL | Status: DC

## 2012-11-03 MED ORDER — DIAZEPAM 5 MG PO TABS: 5.0000 mg | ORAL_TABLET | Freq: Three times a day (TID) | ORAL | Status: DC | PRN

## 2012-11-03 MED ORDER — SILVER SULFADIAZINE 1 % EX CREA: TOPICAL_CREAM | Freq: Every day | CUTANEOUS | Status: DC

## 2012-11-03 MED ORDER — HYDROMORPHONE HCL 2 MG PO TABS: 2.0000 mg | ORAL_TABLET | ORAL | Status: DC | PRN
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
## 2012-11-03 NOTE — Progress Notes (Signed)
Very pleasant.  Duragesic patch has been effective.  May be able to go home tomorrow.  This patient has been seen and I agree with the findings and treatment plan.  Marta Lamas. Gae Bon, MD, FACS [phone] (pager) [phone] (direct pager) Trauma Surgeon
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
## 2012-11-03 NOTE — Progress Notes (Signed)
UR completed 

## 2012-11-03 NOTE — Progress Notes (Signed)
HHPT arranged with [facility] per pt choice and insurance coverage constraints. Address and phone in [facility] are correct. MATCH program arranged for pt and family member was shown directions to outpt pharmacy at [facility] to fill them today before 5:30pm.

## 2012-11-04 LAB — BASIC METABOLIC PANEL
Chloride: 104 mEq/L (ref 96–112)
Creatinine, Ser: 1.63 mg/dL — ABNORMAL HIGH (ref 0.50–1.10)
GFR calc Af Amer: 48 mL/min — ABNORMAL LOW (ref >90)
GFR calc non Af Amer: 41 mL/min — ABNORMAL LOW (ref >90)
Potassium: 3.6 mEq/L (ref 3.5–5.1)

## 2012-11-04 NOTE — Progress Notes (Signed)
Patient being discharged home with boyfriend. Prescriptions given by MD. Discharged instructions given.

## 2012-11-04 NOTE — Progress Notes (Signed)
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 Pain overall controlled and no nausea, tolerating diet. Main c/o is that the stump feels "tight" and is more so than yesterday  Objective: Vital signs in last 24 hours: Temp:  [97.9 F (36.6 C)-99.7 F (37.6 C)] 99 F (37.2 C) (07/12 0553) Pulse Rate:  [73-85] 85 (07/12 0553) Resp:  [18] 18 (07/12 0553) BP: (113-134)/(73-87) 113/73 mmHg (07/12 0553) SpO2:  [95 %-100 %] 95 % (07/12 0553) Last BM Date: 11/02/12  Intake/Output from previous day: 07/11 0701 - 07/12 0700 In: 800 [P.O.:800] Out: -   General appearance: alert, cooperative and no distress Resp: clear to auscultation bilaterally GI: soft, non-tender; bowel sounds normal; no masses,  no organomegaly Extremities: Very tender stump, dressing dry and didn't change pending ortho review. No erythema   Lab Results:  Results for orders placed during the hospital encounter of 10/27/12 (from the past 24 hour(s))  BASIC METABOLIC PANEL     Status: Abnormal   Collection Time    11/04/12  4:25 AM      Result Value Range   Sodium 140  135 - 145 mEq/L   Potassium 3.6  3.5 - 5.1 mEq/L   Chloride 104  96 - 112 mEq/L   CO2 29  19 - 32 mEq/L   Glucose, Bld 96  70 - 99 mg/dL   BUN 8  6 - 23 mg/dL   Creatinine, Ser 1.61 (*) 0.50 - 1.10 mg/dL   Calcium 8.6  8.4 - 09.6 mg/dL   GFR calc non Af Amer 41 (*) >90 mL/min   GFR calc Af Amer 48 (*) >90 mL/min     Studies/Results Radiology     MEDS, Scheduled . diazepam  5 mg Oral QHS  . docusate sodium  100 mg Oral BID  . enoxaparin (LOVENOX) injection  40 mg Subcutaneous Q24H  . feeding supplement  237 mL Oral TID WC  . fentaNYL  50 mcg Transdermal Q72H  . neomycin-bacitracin-polymyxin   Topical BID  . ondansetron  4 mg Oral Q4H   Or  .  ondansetron (ZOFRAN) IV  4 mg Intravenous Q4H  . polyethylene glycol  17 g Oral Daily  . silver sulfADIAZINE   Topical Daily       LOS: 8 days    Currie Paris, MD, [facility], [place] [phone]   11/04/2012 10:35 AM

## 2012-11-07 ENCOUNTER — Telehealth (HOSPITAL_COMMUNITY): Payer: Self-pay | Admitting: Emergency Medicine

## 2012-11-07 ENCOUNTER — Encounter: Payer: Self-pay | Admitting: Family Medicine

## 2012-11-07 ENCOUNTER — Telehealth (INDEPENDENT_AMBULATORY_CARE_PROVIDER_SITE_OTHER): Payer: Self-pay | Admitting: General Surgery

## 2012-11-07 NOTE — Telephone Encounter (Signed)
Spoke with physical therapist, Trey Paula.  He states she is doing fairly well with PT, but would benefit from OT.  Offered to send request, states he will take care of it from here on out and fax paperwork for NP/PA/MD to sign.

## 2012-11-07 NOTE — Telephone Encounter (Signed)
See previous phone note.  

## 2012-11-10 ENCOUNTER — Other Ambulatory Visit: Payer: Self-pay | Admitting: Family Medicine
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
## 2012-11-13 NOTE — Telephone Encounter (Signed)
LM on identifiable VM for patient telling her that Dr, Ermalinda Memos refilled the diflucan and offered to schedule hospital f/u appt.  Mikiya Nebergall, Darlyne Russian, CMA

## 2012-11-14 ENCOUNTER — Telehealth (HOSPITAL_COMMUNITY): Payer: Self-pay | Admitting: Emergency Medicine

## 2012-11-14 NOTE — Telephone Encounter (Signed)
Gave order for tub seat.
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
## 2012-11-28 ENCOUNTER — Other Ambulatory Visit (HOSPITAL_COMMUNITY)
Admission: RE | Admit: 2012-11-28 | Discharge: 2012-11-28 | Disposition: A | Discharge status: Home / Self Care | Payer: Medicaid Other | Source: Ambulatory Visit | Attending: Family Medicine | Admitting: Family Medicine

## 2012-11-28 ENCOUNTER — Encounter (HOSPITAL_COMMUNITY): Payer: Self-pay | Admitting: *Deleted

## 2012-11-28 ENCOUNTER — Ambulatory Visit (HOSPITAL_COMMUNITY)
Admission: RE | Admit: 2012-11-28 | Discharge: 2012-11-28 | Disposition: A | Discharge status: Home / Self Care | Payer: Self-pay | Attending: Psychiatry | Admitting: Psychiatry

## 2012-11-28 ENCOUNTER — Ambulatory Visit (INDEPENDENT_AMBULATORY_CARE_PROVIDER_SITE_OTHER): Payer: Medicaid Other | Admitting: Family Medicine

## 2012-11-28 ENCOUNTER — Encounter: Payer: Self-pay | Admitting: Family Medicine

## 2012-11-28 VITALS — BP 124/84 | Temp 98.9°F | Wt 148.0 lb

## 2012-11-28 DIAGNOSIS — R45851 Suicidal ideations: Secondary | ICD-10-CM

## 2012-11-28 DIAGNOSIS — R300 Dysuria: Secondary | ICD-10-CM

## 2012-11-28 DIAGNOSIS — K625 Hemorrhage of anus and rectum: Secondary | ICD-10-CM

## 2012-11-28 DIAGNOSIS — Z309 Encounter for contraceptive management, unspecified: Secondary | ICD-10-CM

## 2012-11-28 DIAGNOSIS — K921 Melena: Secondary | ICD-10-CM | POA: Insufficient documentation

## 2012-11-28 DIAGNOSIS — F4323 Adjustment disorder with mixed anxiety and depressed mood: Secondary | ICD-10-CM | POA: Insufficient documentation

## 2012-11-28 DIAGNOSIS — N912 Amenorrhea, unspecified: Secondary | ICD-10-CM

## 2012-11-28 DIAGNOSIS — Z113 Encounter for screening for infections with a predominantly sexual mode of transmission: Secondary | ICD-10-CM | POA: Insufficient documentation

## 2012-11-28 DIAGNOSIS — N898 Other specified noninflammatory disorders of vagina: Secondary | ICD-10-CM

## 2012-11-28 DIAGNOSIS — R109 Unspecified abdominal pain: Secondary | ICD-10-CM | POA: Insufficient documentation

## 2012-11-28 HISTORY — DX: Major depressive disorder, single episode, unspecified: F32.9

## 2012-11-28 HISTORY — DX: Depression, unspecified: F32.A

## 2012-11-28 LAB — POCT WET PREP (WET MOUNT): Clue Cells Wet Prep Whiff POC: POSITIVE

## 2012-11-28 LAB — POCT UA - MICROSCOPIC ONLY

## 2012-11-28 LAB — POCT URINALYSIS DIPSTICK
Blood, UA: NEGATIVE
Ketones, UA: >=160
Protein, UA: 30
pH, UA: 6.5

## 2012-11-28 MED ORDER — METRONIDAZOLE 500 MG PO TABS: 500.0000 mg | ORAL_TABLET | Freq: Two times a day (BID) | ORAL | Status: DC

## 2012-11-28 MED ORDER — ONDANSETRON 4 MG PO TBDP: 4.0000 mg | ORAL_TABLET | Freq: Three times a day (TID) | ORAL | Status: DC | PRN

## 2012-11-28 NOTE — Patient Instructions (Signed)
Please Seek help right away if your bleeding becomes worse.
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
## 2012-11-28 NOTE — Assessment & Plan Note (Signed)
Previously used NuvaRing which fell out on Valsalva.  We'll discuss on next visit in 2 weeks.

## 2012-11-28 NOTE — Assessment & Plan Note (Signed)
New onset 2 days ago, prior dose streaking her stool. Check CBC today

## 2012-11-28 NOTE — Addendum Note (Signed)
Addended by: Elenora Gamma on: 11/28/2012 05:46 PM   Modules accepted: Orders

## 2012-11-28 NOTE — Assessment & Plan Note (Signed)
Suicidal ideation for the past several days with a plan along with multiple symptoms of depression PHQ-9 9 score was 22 Possibly due to adjustment disorder Sent to behavioral health facility with a family member Plan followup for mood as soon as possible after discharge from behavioral health.

## 2012-11-28 NOTE — Assessment & Plan Note (Signed)
Clue cells noted in the urinalysis Micro and a wet prep. Treatment Flagyl 500 mg twice a day x7 days Possible contribution to her abdominal pain  Also collected [facility]

## 2012-11-28 NOTE — Assessment & Plan Note (Addendum)
With narcotic use and discontinuation of her MiraLax as likely constipation related. Unlikely obstruction given flatus and inconsistent emesis Possibly also related to bacterial vaginosis Advised to restart MiraLax 17 g twice a day and titrate to pudding consistency stools.   Also discussed that with her high narcotic use she may need to continue MiraLax With vaginal discharge and unprotected sex also perform CBC to help rule out PID.
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
## 2012-11-29 LAB — CBC WITH DIFFERENTIAL/PLATELET
Basophils Absolute: 0 10*3/uL (ref 0.0–0.1)
Basophils Relative: 1 % (ref 0–1)
Eosinophils Relative: 2 % (ref 0–5)
HCT: 38.9 % (ref 36.0–46.0)
Hemoglobin: 13.1 g/dL (ref 12.0–15.0)
MCHC: 33.7 g/dL (ref 30.0–36.0)
MCV: 88.8 fL (ref 78.0–100.0)
Monocytes Absolute: 0.5 10*3/uL (ref 0.1–1.0)
Monocytes Relative: 8 % (ref 3–12)
Neutro Abs: 4 10*3/uL (ref 1.7–7.7)
RDW: 14.5 % (ref 11.5–15.5)

## 2012-12-04 ENCOUNTER — Telehealth: Payer: Self-pay | Admitting: Family Medicine

## 2012-12-04 NOTE — Telephone Encounter (Signed)
Called patient to discuss recent visit.  Now denies suicidal ideation but states that she is having intermittent periods of depressed feelings. She is seeking counseling with a local therapist but has a list of call to make and states that she was not offered therapy at behavioral health. She contracts for safety.  He states that her abdominal pain is improved and she is having more regular bowel movements now. She plans to followup with me as scheduled in 2 weeks.  Murtis Sink, MD [facility] Resident, PGY-2 12/04/2012, 11:29 AM

## 2012-12-20 ENCOUNTER — Ambulatory Visit (INDEPENDENT_AMBULATORY_CARE_PROVIDER_SITE_OTHER): Payer: Medicaid Other | Admitting: Family Medicine

## 2012-12-20 VITALS — BP 104/69 | HR 80 | Ht 64.0 in | Wt 149.0 lb

## 2012-12-20 DIAGNOSIS — G8911 Acute pain due to trauma: Secondary | ICD-10-CM

## 2012-12-20 DIAGNOSIS — S48112A Complete traumatic amputation at level between left shoulder and elbow, initial encounter: Secondary | ICD-10-CM

## 2012-12-20 DIAGNOSIS — F4323 Adjustment disorder with mixed anxiety and depressed mood: Secondary | ICD-10-CM

## 2012-12-20 DIAGNOSIS — IMO0002 Reserved for concepts with insufficient information to code with codable children: Secondary | ICD-10-CM

## 2012-12-20 DIAGNOSIS — K5900 Constipation, unspecified: Secondary | ICD-10-CM

## 2012-12-20 MED ORDER — OXYCODONE HCL 5 MG PO TABS: 5.0000 mg | ORAL_TABLET | ORAL | Status: DC | PRN

## 2012-12-20 MED ORDER — OXYCODONE HCL ER 10 MG PO T12A: 10.0000 mg | EXTENDED_RELEASE_TABLET | Freq: Two times a day (BID) | ORAL | Status: DC

## 2012-12-20 MED ORDER — POLYETHYLENE GLYCOL 3350 17 GM/SCOOP PO POWD: 17.0000 g | Freq: Every day | ORAL | Status: DC

## 2012-12-20 NOTE — Progress Notes (Signed)
  Subjective:    Patient ID: Jillian Hernandez, female    DOB: [birth year], 32 y.o.   MRN: [id number]  HPI Pt here to follow up for depression, and arm pain  Depression, improved but not resolved Difficulty dealing with traumatic amputation Unable to locate psychologist so far, making multiple calls Denies SI PHQ 9 given today Score: 16, 3 on 1-5, 1 on 6, 0 on 7-9, somewhat difficult  Arm pain Continued, using fentanyl patch 25 X 2 Q 3 days Has stopped using dilaudid, makes her sleepy and has 5 left Continued L arm pain, phantom pain present but tolerable Takes valium when it's bad  Contraception- would like to continue nuva-ring, previous problem with IUD, will forget pill she says.   Constipation- BM's Q 3-4 days, denies abd pain now  Review of Systems Per HPI    Objective:   Physical Exam  Gen: NAD, alert, cooperative with exam, amputated L arm above elbow HEENT: NCAT CV: RRR, good S1/S2, no murmur Resp: CTABL, no wheezes, non-labored Ext: No edema, warm Neuro: Alert and oriented, No gross deficits PSych: appropriate mood and affect,     Assessment & Plan:

## 2012-12-20 NOTE — Patient Instructions (Addendum)
Come back in 2-3 weeks  It was great to see you tioday  I have some numbers: [facility] [phone] [facility] [phone]  Lets cut back to 1 patch daily,  Add 1-2 pills of OxyContin twice daily Add oxycodone 5 mg for breakthrough pain

## 2012-12-20 NOTE — Assessment & Plan Note (Signed)
2/2 opioid use, add miralax, titrate to pudding consistency stools Discussed decreasing need as opiate dose decreases

## 2012-12-20 NOTE — Assessment & Plan Note (Signed)
Wanting to decrease opiate use, nearly controlled on current fentanyl Advised to stop dilaudid and discard- she had already stiopped Change to 25 mcg fentanyl patch Q 3 days Add 10 oxycontin BID, can increase to 20 BID if consistently needing oxy IR Oxy IR 5 mg Q6 PRN for breakthrough Follow up 2 weeks for continued down-titration

## 2012-12-20 NOTE — Assessment & Plan Note (Signed)
Improving Needs to psychology, she is seeking one out, given some numbers of who accepts medicaid No SI/HI Follow up 2-4 weeks

## 2012-12-21 ENCOUNTER — Telehealth: Payer: Self-pay | Admitting: *Deleted

## 2012-12-21 DIAGNOSIS — S48112A Complete traumatic amputation at level between left shoulder and elbow, initial encounter: Secondary | ICD-10-CM

## 2012-12-21 NOTE — Telephone Encounter (Signed)
Received prior authorization form from [place] aid for oxycontin.  Form placed in doctor's box for completion, attached are the preferred drugs. Will have md return form after completion to me. Wyatt Haste, RN-BSN

## 2012-12-22 MED ORDER — MORPHINE SULFATE 15 MG PO TABS: 15.0000 mg | ORAL_TABLET | ORAL | Status: DC | PRN

## 2012-12-22 MED ORDER — MORPHINE SULFATE ER 15 MG PO TBCR: 15.0000 mg | EXTENDED_RELEASE_TABLET | Freq: Two times a day (BID) | ORAL | Status: DC

## 2012-12-22 NOTE — Telephone Encounter (Signed)
Patient is calling Dr. Ermalinda Memos from his message just before noon.

## 2012-12-22 NOTE — Telephone Encounter (Signed)
Discussed with pt the cahnge, she understands. Called pharmacy who canceled previous Rx for oxycontin.   Rx placed up front for her to pick up  Murtis Sink, MD [facility] Resident, PGY-2 12/22/2012, 2:53 PM

## 2012-12-22 NOTE — Telephone Encounter (Signed)
Called to discuss pain meds, I am trying to wean her from fentanyl patches and medicaid would prefer me use a different opiate.   I will plan to switch to MS contin and morphine, If she calls back please ensure that she has not filled the other scripts and I will write her new ones as long as she understands to destroy the other ones.   I will plan to write them when she understands this and place them up front for her to pick up.

## 2013-01-03 ENCOUNTER — Ambulatory Visit (INDEPENDENT_AMBULATORY_CARE_PROVIDER_SITE_OTHER): Payer: Medicaid Other | Admitting: Family Medicine

## 2013-01-03 VITALS — BP 108/74 | HR 84 | Temp 98.4°F | Ht 64.0 in | Wt 144.0 lb

## 2013-01-03 DIAGNOSIS — S48112A Complete traumatic amputation at level between left shoulder and elbow, initial encounter: Secondary | ICD-10-CM

## 2013-01-03 DIAGNOSIS — IMO0002 Reserved for concepts with insufficient information to code with codable children: Secondary | ICD-10-CM

## 2013-01-03 DIAGNOSIS — F1120 Opioid dependence, uncomplicated: Secondary | ICD-10-CM

## 2013-01-03 DIAGNOSIS — F1193 Opioid use, unspecified with withdrawal: Secondary | ICD-10-CM | POA: Insufficient documentation

## 2013-01-03 NOTE — Assessment & Plan Note (Signed)
Source of pain, some phantom and some stump Treated as described above Patient desires to be off of narcotics.
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
## 2013-01-03 NOTE — Assessment & Plan Note (Signed)
Discussed that his is obvious source of her symptoms Given Rx I previously wrote for her for 15 mg MS contin and 15  Mg MSIR  Advised that she could use 1 fentanyl patch if she'd likely plus MS contin, but that MS contin alone will likely control her symptoms and then we can work out a wean She only uses benzos every 2-3 days so I am not concerned about withdrawal from that.  Follow up 2 weeks for further taper adn close follow up of her mood.

## 2013-01-03 NOTE — Patient Instructions (Signed)
Lets follow up in 2 weeks

## 2013-01-12 ENCOUNTER — Ambulatory Visit: Payer: Self-pay | Admitting: Family Medicine

## 2013-01-18 ENCOUNTER — Ambulatory Visit (INDEPENDENT_AMBULATORY_CARE_PROVIDER_SITE_OTHER): Payer: Medicaid Other | Admitting: Family Medicine

## 2013-01-18 ENCOUNTER — Encounter: Payer: Self-pay | Admitting: Family Medicine

## 2013-01-18 VITALS — BP 107/70 | HR 84 | Ht 64.0 in | Wt 147.0 lb

## 2013-01-18 DIAGNOSIS — F4323 Adjustment disorder with mixed anxiety and depressed mood: Secondary | ICD-10-CM

## 2013-01-18 DIAGNOSIS — G4700 Insomnia, unspecified: Secondary | ICD-10-CM | POA: Insufficient documentation

## 2013-01-18 DIAGNOSIS — F1120 Opioid dependence, uncomplicated: Secondary | ICD-10-CM

## 2013-01-18 DIAGNOSIS — F1193 Opioid use, unspecified with withdrawal: Secondary | ICD-10-CM

## 2013-01-18 MED ORDER — OXYCODONE HCL 5 MG PO CAPS: 5.0000 mg | ORAL_CAPSULE | Freq: Four times a day (QID) | ORAL | Status: DC

## 2013-01-18 MED ORDER — TRAZODONE HCL 50 MG PO TABS: 50.0000 mg | ORAL_TABLET | Freq: Every day | ORAL | Status: DC

## 2013-01-18 NOTE — Assessment & Plan Note (Signed)
Improving, declines cymbalta trial Maybe contributing to insomnia Continue to follow.

## 2013-01-18 NOTE — Assessment & Plan Note (Signed)
Onset since starting MS contin, maybe lack of sedation from high opiate dose vs mood vs MS effect Will change narcotic to oxycodone Also given trazadone 50 mg qHS, discussed sleep hygiene F/u 2-4 weeks.
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
## 2013-01-18 NOTE — Assessment & Plan Note (Addendum)
Symptoms controlled with restarting opiates, only using 15 mg MS Contin BID now Would like to stop now, states that she flushes her extra pills - although I dont like the idea of opiates in the sewer system i am glad she is disposing of them.  Will decrease dose by roughly 30%, change to oxycodone IR 5 mg QID Will taper with QID for 1 week, TID for 1 week, BID for 1 week, then qd for 1 week, then stop Rx written for oxycodone IR 5 mg #70 to cover entire taper.  Follow up 2-4 weeks.   Called personally and canceled the Rx which was not picked up, MSIR 15 mg.

## 2013-01-18 NOTE — Patient Instructions (Addendum)
It was great to see you today!  Lets change your medicine to oxycodone  Take 1 pill 4 times daily for 1 week 1 pill 3 times a day for 1 week 1 pill 2 times daily for 1 week 1 pill 1 times daily for 1 week  Lets follow up in 2 weeks.

## 2013-02-01 ENCOUNTER — Ambulatory Visit: Payer: Self-pay | Admitting: Family Medicine

## 2013-02-15 ENCOUNTER — Ambulatory Visit (INDEPENDENT_AMBULATORY_CARE_PROVIDER_SITE_OTHER): Payer: Medicaid Other | Admitting: Family Medicine

## 2013-02-15 ENCOUNTER — Encounter: Payer: Self-pay | Admitting: Family Medicine

## 2013-02-15 VITALS — BP 118/72 | HR 76 | Temp 98.5°F | Ht 64.0 in | Wt 154.0 lb

## 2013-02-15 DIAGNOSIS — F1120 Opioid dependence, uncomplicated: Secondary | ICD-10-CM

## 2013-02-15 DIAGNOSIS — F1193 Opioid use, unspecified with withdrawal: Secondary | ICD-10-CM

## 2013-02-15 DIAGNOSIS — G4700 Insomnia, unspecified: Secondary | ICD-10-CM

## 2013-02-15 DIAGNOSIS — F4323 Adjustment disorder with mixed anxiety and depressed mood: Secondary | ICD-10-CM

## 2013-02-15 MED ORDER — QUETIAPINE FUMARATE 100 MG PO TABS: 300.0000 mg | ORAL_TABLET | Freq: Every day | ORAL | Status: DC

## 2013-02-15 NOTE — Assessment & Plan Note (Signed)
Multifactorial with recent high dose opiate use, mood d/o Will check TSH Seroquel for PTSD vs BPD vs Adjustment d/o vs depression will also help.
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
## 2013-02-15 NOTE — Assessment & Plan Note (Signed)
Successfully off, using 5 mg oxycodone PRN sparingly.

## 2013-02-15 NOTE — Patient Instructions (Addendum)
It was great to see you  Lets try to get you to a therapist.   Follow up in 2 weeks with me  Take 1/2 pill on day 1, 1 pill on day 2, 2 on day 3, and 3 on day 4. Take three pills nightly form there.

## 2013-02-15 NOTE — Assessment & Plan Note (Addendum)
Her mood is very likely contributing to her insomnia Seems multifactorial with features consistent with adjustment d/o, depression, PTSD, and BPD POsitive screen on MDQ,  Recommended psychiatry at [facility], she elects to try seroquel and seek [facility] after f/u if no improvement Recommended psychology/therapy, She agrees, i gave her a list Started seroquel, will graduate to 300 mg in 4 days, 300 mg qHS after that.  Follow up in 2 weeks.  Dc trazadone  Also check TSH and baseline LFTs with C[facility]

## 2013-02-16 LAB — COMPLETE METABOLIC PANEL WITH GFR
ALT: 27 U/L (ref 0–35)
AST: 20 U/L (ref 0–37)
Albumin: 4.5 g/dL (ref 3.5–5.2)
Alkaline Phosphatase: 44 U/L (ref 39–117)
BUN: 13 mg/dL (ref 6–23)
CO2: 24 mEq/L (ref 19–32)
Calcium: 9.7 mg/dL (ref 8.4–10.5)
Chloride: 105 mEq/L (ref 96–112)
Creat: 1.01 mg/dL (ref 0.50–1.10)
GFR, Est African American: 85 mL/min
GFR, Est Non African American: 74 mL/min
Glucose, Bld: 83 mg/dL (ref 70–99)
Potassium: 4.2 mEq/L (ref 3.5–5.3)
Sodium: 138 mEq/L (ref 135–145)
Total Bilirubin: 0.3 mg/dL (ref 0.3–1.2)
Total Protein: 7.8 g/dL (ref 6.0–8.3)

## 2013-02-16 LAB — TSH: TSH: 0.684 u[IU]/mL (ref 0.350–4.500)

## 2013-03-20 ENCOUNTER — Telehealth: Payer: Self-pay | Admitting: Family Medicine

## 2013-03-20 NOTE — Telephone Encounter (Signed)
Will fwd. To PCP. Thanks. .Levii Hairfield  

## 2013-03-20 NOTE — Telephone Encounter (Signed)
Letter faxed to [phone] per patient's request.  LMOVM notifying patient. Deloris Moger, Darlyne Russian, CMA

## 2013-03-20 NOTE — Telephone Encounter (Signed)
Will write letter to return for work and ask team to fax it as I am off-site.   Murtis Sink, MD [facility] Resident, PGY-2 03/20/2013, 10:34 AM

## 2013-03-20 NOTE — Telephone Encounter (Signed)
Pt needs note from dr faxed to employer at [phone] stating she has been released to return to work

## 2013-03-21 ENCOUNTER — Ambulatory Visit
Admission: RE | Admit: 2013-03-21 | Discharge: 2013-03-21 | Disposition: A | Discharge status: Home / Self Care | Payer: Medicaid Other | Source: Ambulatory Visit | Attending: Family Medicine | Admitting: Family Medicine

## 2013-03-21 ENCOUNTER — Encounter: Payer: Self-pay | Admitting: Family Medicine

## 2013-03-21 ENCOUNTER — Ambulatory Visit (INDEPENDENT_AMBULATORY_CARE_PROVIDER_SITE_OTHER): Payer: Medicaid Other | Admitting: Family Medicine

## 2013-03-21 VITALS — BP 110/75 | HR 63 | Temp 98.8°F | Ht 64.0 in | Wt 157.0 lb

## 2013-03-21 DIAGNOSIS — M79609 Pain in unspecified limb: Secondary | ICD-10-CM

## 2013-03-21 DIAGNOSIS — F4323 Adjustment disorder with mixed anxiety and depressed mood: Secondary | ICD-10-CM

## 2013-03-21 DIAGNOSIS — T8789 Other complications of amputation stump: Secondary | ICD-10-CM

## 2013-03-21 MED ORDER — GABAPENTIN 100 MG PO CAPS: 100.0000 mg | ORAL_CAPSULE | Freq: Three times a day (TID) | ORAL | Status: DC

## 2013-03-21 MED ORDER — QUETIAPINE FUMARATE 200 MG PO TABS: 200.0000 mg | ORAL_TABLET | Freq: Every day | ORAL | Status: DC

## 2013-03-21 MED ORDER — OXYCODONE HCL 5 MG PO CAPS: 5.0000 mg | ORAL_CAPSULE | Freq: Four times a day (QID) | ORAL | Status: DC | PRN

## 2013-03-21 NOTE — Progress Notes (Signed)
  Subjective:    Patient ID: Jillian Hernandez, female    DOB: 1981/03/24, 32 y.o.   MRN: [id number]  HPI  32 year old female here for acute stump pain.  She states this is distinctly different than her previous stump pain that's been going on while is healing. She describes a 10 out of 10 needle like pain in the lower portion of her left shoulder stone. She denies radiation, it's worsened by touch, pressure, and movement. She also feels like it's swollen. She denies any redness or warmth at the site.  She is concerned because a couple of sutures that were not previously visible now are.   Mood/sleep  She states she hasn't noticed a big change in her mood since starting Seroquel however she is sleeping much better and that's very nice.  She notes that she's been having night sweats intermittently since starting the medicine that she is dealing with my keeping her window open and using a fan at night. She says this is no deal breaker, because she likes to sleep.  She denies any other side effects such as nausea, vomiting, diarrhea.  She denies nausea, vomiting, chills, and fever. Review of Systems Per HPI    Objective:   Physical Exam  Gen: NAD, alert, cooperative with exam HEENT: NCAT CV: RRR, good S1/S2, no murmur Resp: CTABL, no wheezes, non-labored Abd: SNTND, BS present, no guarding or organomegaly Ext: L UE stump tender to the touch but without overt swelling, erythema, and redness. 2 small visible sutures buried are visible.  Neuro: Alert and oriented, No gross deficits     Assessment & Plan:  See problem specific assessment and plan

## 2013-03-21 NOTE — Patient Instructions (Signed)
It was great to see you today  Lets get an x ray and make an appt with your ortho doctor today.  I dont feel like there is any infection but the things to look out for concerning that are: redness or warmth at the site, fevers, chills, nausea and vomiting. If you develop these please seek immediate medical attention.   I have given you a prescription for oxycodone and gabapentin.  Lets follow up in 1 month

## 2013-03-21 NOTE — Assessment & Plan Note (Signed)
Discretely different pain than her usual phantom pain Concern that embedded sutures are causing localized inflammation and pain.  Discussed possibility of pressure point over bone, developing neuroma, and possible CRPS.  No signs of infection currently but eplained how serious that would be and discussed red flags.  Will treat supportively with PO oxycodone and gabapentin (which I think will be a good long term med for her.  Explained wide dose range of gabapentin and advised to start at 100 BID and ramp up to 300 TID as necessary.   F/u 1 month or sooner if needed.

## 2013-03-21 NOTE — Assessment & Plan Note (Addendum)
Unclear how much improvement with her mood currently but her sleep is reportedly improved greatly.  Acute visit today so couldn't fully re-screen with MDQ/PHQ-9 which I would like to do in a month for objective improvement.  GAD 7 score 13, 1 on 1 and 7, 2 on 2-5, and 3 on #6, somewhat difficult Will continue at current dose - 200 mg qHS.  F/u 1 month.

## 2013-05-24 ENCOUNTER — Encounter: Payer: Self-pay | Admitting: Family Medicine

## 2013-05-24 ENCOUNTER — Ambulatory Visit (INDEPENDENT_AMBULATORY_CARE_PROVIDER_SITE_OTHER): Payer: Medicaid Other | Admitting: Family Medicine

## 2013-05-24 VITALS — BP 123/80 | HR 83 | Ht 64.0 in | Wt 158.0 lb

## 2013-05-24 DIAGNOSIS — M79609 Pain in unspecified limb: Secondary | ICD-10-CM

## 2013-05-24 DIAGNOSIS — M79603 Pain in arm, unspecified: Secondary | ICD-10-CM | POA: Insufficient documentation

## 2013-05-24 DIAGNOSIS — F4323 Adjustment disorder with mixed anxiety and depressed mood: Secondary | ICD-10-CM

## 2013-05-24 MED ORDER — IBUPROFEN 800 MG PO TABS: 800.0000 mg | ORAL_TABLET | Freq: Three times a day (TID) | ORAL | Status: DC

## 2013-05-24 MED ORDER — VENLAFAXINE HCL ER 37.5 MG PO CP24: 37.5000 mg | ORAL_CAPSULE | Freq: Every day | ORAL | Status: DC

## 2013-05-24 MED ORDER — METRONIDAZOLE 0.75 % VA GEL: 1.0000 | Freq: Two times a day (BID) | VAGINAL | Status: DC
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
## 2013-05-24 NOTE — Patient Instructions (Signed)
It was great to see today  Please stop the gabapentin  Take ibuprofen every 8 hours for the next 5 days, also try ice for 15 minutes every 2 hours as needed.  I've added a new medicine called venlafaxine. Take one pill once a day for the next 2 weeks and then followup with me. Will increase your dose to 2 pills once a day after that.
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
## 2013-05-24 NOTE — Assessment & Plan Note (Signed)
The etiology of her arm pain is unclear. Of course she has component of phantom arm pain on the left, however this right arm pain is difficult to explain. We'll treat for overuse with scheduled NSAIDs for 5 days and ice Gabapentin (she took 1100 mg twice daily without relief) did not help so I will DC it at this time Continue Seroquel as I think it had a beneficial effect on her mood, we'll add Effexor for adjuvant of chronic pain. Followup 2 weeks

## 2013-06-27 ENCOUNTER — Other Ambulatory Visit: Payer: Self-pay | Admitting: *Deleted

## 2013-06-27 DIAGNOSIS — M79603 Pain in arm, unspecified: Secondary | ICD-10-CM

## 2013-06-27 MED ORDER — VENLAFAXINE HCL ER 75 MG PO CP24: 75.0000 mg | ORAL_CAPSULE | Freq: Every day | ORAL | Status: DC

## 2013-06-27 NOTE — Telephone Encounter (Signed)
Will titrate med to normal dose of 75 daily as discussed with the patient and ask my nurse to call and request clinic follow up.   Murtis SinkSam Hatcher Froning, MD [facility] Resident, PGY-2 06/27/2013, 2:36 PM

## 2013-06-28 NOTE — Telephone Encounter (Signed)
Scheduled for Friday 3/20 @ 1:30 pm with Dr. Ermalinda MemosBradshaw

## 2013-07-13 ENCOUNTER — Ambulatory Visit (INDEPENDENT_AMBULATORY_CARE_PROVIDER_SITE_OTHER): Payer: Medicaid Other | Admitting: Family Medicine

## 2013-07-13 ENCOUNTER — Encounter: Payer: Self-pay | Admitting: Family Medicine

## 2013-07-13 VITALS — BP 108/72 | HR 59 | Temp 98.5°F | Ht 64.0 in | Wt 163.0 lb

## 2013-07-13 DIAGNOSIS — M79603 Pain in arm, unspecified: Secondary | ICD-10-CM

## 2013-07-13 DIAGNOSIS — R209 Unspecified disturbances of skin sensation: Secondary | ICD-10-CM

## 2013-07-13 DIAGNOSIS — M79609 Pain in unspecified limb: Secondary | ICD-10-CM

## 2013-07-13 DIAGNOSIS — G4700 Insomnia, unspecified: Secondary | ICD-10-CM

## 2013-07-13 DIAGNOSIS — F4323 Adjustment disorder with mixed anxiety and depressed mood: Secondary | ICD-10-CM

## 2013-07-13 DIAGNOSIS — R200 Anesthesia of skin: Secondary | ICD-10-CM | POA: Insufficient documentation

## 2013-07-13 MED ORDER — VENLAFAXINE HCL ER 75 MG PO CP24: 75.0000 mg | ORAL_CAPSULE | Freq: Every day | ORAL | Status: DC

## 2013-07-13 MED ORDER — ZOLPIDEM TARTRATE 5 MG PO TABS: 5.0000 mg | ORAL_TABLET | Freq: Every evening | ORAL | Status: DC | PRN

## 2013-07-13 MED ORDER — IBUPROFEN 800 MG PO TABS: 800.0000 mg | ORAL_TABLET | Freq: Three times a day (TID) | ORAL | Status: DC

## 2013-07-13 NOTE — Patient Instructions (Signed)
It was great to see you today!  Please try the effexor for the next 2 months and come back to see me. PLease come back to see me or call if you have side effects like we discussed.   I have prescribed ambien for sleep

## 2013-07-13 NOTE — Assessment & Plan Note (Signed)
Some L stump pain now tolerated with advil Taking advil 800 mg 1 to 3 times daily

## 2013-07-13 NOTE — Assessment & Plan Note (Signed)
Multifactorial Using her sisters valium, 5 mg- discussed this is controlled and that i don't prefer to use benzos for sleep Has not had good results with trazadone, some improvement with seroquel Considering she is using her sisters will trial ambien as a preferable alternative to a benzo Continue to follow
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
## 2013-07-13 NOTE — Assessment & Plan Note (Signed)
R wrist pain with hand numbness/tingling/hot and cold discrepancies suspicious for carpel tunnel Trial of Cock-up splint Will consider EMG and surgical referral if worsens or doesn't improve

## 2013-07-13 NOTE — Assessment & Plan Note (Signed)
She did not try her effexor for long enough to says if it would have helped or not, She agrees to longer trial Continue seroquel, try effexor

## 2013-08-31 ENCOUNTER — Telehealth: Payer: Self-pay | Admitting: Family Medicine

## 2013-08-31 MED ORDER — METRONIDAZOLE 0.75 % EX GEL: 1.0000 "application " | Freq: Two times a day (BID) | CUTANEOUS | Status: DC

## 2013-08-31 NOTE — Telephone Encounter (Signed)
Pt notified.  Burnham Trost L Artie Takayama, CMA  

## 2013-08-31 NOTE — Telephone Encounter (Signed)
Pt with recurrent BV, metro gel is reasonable. It has been a very long time since I prescribed this though so I will discuss that she normally needs to be seen for this in the future at our next appt.   Murtis SinkSam Lexxus Underhill, MD [facility] Resident, PGY-2 08/31/2013, 4:13 PM

## 2013-08-31 NOTE — Telephone Encounter (Signed)
Pt called to say that the rx for her Metronidazole was in pill form and she need it to be the cream type.  Cannot take the pills.  Would like the change to be made today.  Call back if needing to speak with her.

## 2013-08-31 NOTE — Telephone Encounter (Signed)
Pt called the pharmacy for a refill on Metro Gel that was given 05/24/2013.  Pharmacy had a Rx on file for patient from 11/28/2012 for the pills, that is what they gave her.  Will fwd request to MD.  Radene OuKristen L Conor Filsaime, CMA

## 2013-09-12 ENCOUNTER — Ambulatory Visit: Payer: Self-pay | Admitting: Family Medicine

## 2013-10-02 ENCOUNTER — Other Ambulatory Visit: Payer: Self-pay | Admitting: Family Medicine

## 2013-10-02 NOTE — Telephone Encounter (Signed)
Pt said pharmacy is not refilling her nuvaring Today is the day to insert it Please rush

## 2014-02-25 ENCOUNTER — Encounter: Payer: Self-pay | Admitting: Family Medicine

## 2014-09-13 ENCOUNTER — Ambulatory Visit (INDEPENDENT_AMBULATORY_CARE_PROVIDER_SITE_OTHER): Payer: Self-pay | Admitting: Family Medicine

## 2014-09-13 ENCOUNTER — Encounter: Payer: Self-pay | Admitting: Family Medicine

## 2014-09-13 ENCOUNTER — Other Ambulatory Visit (HOSPITAL_COMMUNITY)
Admission: RE | Admit: 2014-09-13 | Discharge: 2014-09-13 | Disposition: A | Discharge status: Home / Self Care | Payer: No Typology Code available for payment source | Source: Ambulatory Visit | Attending: Family Medicine | Admitting: Family Medicine

## 2014-09-13 VITALS — BP 114/61 | HR 69 | Temp 99.3°F | Ht 64.0 in | Wt 165.7 lb

## 2014-09-13 DIAGNOSIS — T8789 Other complications of amputation stump: Secondary | ICD-10-CM

## 2014-09-13 DIAGNOSIS — Z1151 Encounter for screening for human papillomavirus (HPV): Secondary | ICD-10-CM | POA: Insufficient documentation

## 2014-09-13 DIAGNOSIS — G4700 Insomnia, unspecified: Secondary | ICD-10-CM

## 2014-09-13 DIAGNOSIS — B9689 Other specified bacterial agents as the cause of diseases classified elsewhere: Secondary | ICD-10-CM

## 2014-09-13 DIAGNOSIS — Z124 Encounter for screening for malignant neoplasm of cervix: Secondary | ICD-10-CM

## 2014-09-13 DIAGNOSIS — Z113 Encounter for screening for infections with a predominantly sexual mode of transmission: Secondary | ICD-10-CM

## 2014-09-13 DIAGNOSIS — M79609 Pain in unspecified limb: Secondary | ICD-10-CM

## 2014-09-13 DIAGNOSIS — A499 Bacterial infection, unspecified: Secondary | ICD-10-CM

## 2014-09-13 DIAGNOSIS — N760 Acute vaginitis: Secondary | ICD-10-CM

## 2014-09-13 DIAGNOSIS — Z01411 Encounter for gynecological examination (general) (routine) with abnormal findings: Secondary | ICD-10-CM | POA: Insufficient documentation

## 2014-09-13 DIAGNOSIS — N898 Other specified noninflammatory disorders of vagina: Secondary | ICD-10-CM

## 2014-09-13 DIAGNOSIS — F4323 Adjustment disorder with mixed anxiety and depressed mood: Secondary | ICD-10-CM

## 2014-09-13 LAB — POCT WET PREP (WET MOUNT)

## 2014-09-13 MED ORDER — METRONIDAZOLE 500 MG PO TABS: 500.0000 mg | ORAL_TABLET | Freq: Two times a day (BID) | ORAL | Status: DC

## 2014-09-13 MED ORDER — IBUPROFEN 600 MG PO TABS: 600.0000 mg | ORAL_TABLET | Freq: Four times a day (QID) | ORAL | Status: DC | PRN

## 2014-09-13 MED ORDER — PREGABALIN 75 MG PO CAPS: 75.0000 mg | ORAL_CAPSULE | Freq: Two times a day (BID) | ORAL | Status: DC

## 2014-09-13 MED ORDER — METRONIDAZOLE 0.75 % EX GEL: 1.0000 "application " | Freq: Two times a day (BID) | CUTANEOUS | Status: DC

## 2014-09-13 MED ORDER — ZOLPIDEM TARTRATE 5 MG PO TABS: 5.0000 mg | ORAL_TABLET | Freq: Every evening | ORAL | Status: DC | PRN

## 2014-09-13 NOTE — Patient Instructions (Addendum)
Great to see you!  I will send your results when they are all in, we will call in metrogel depending on how the test today goes  Try lyrica, I think and hope you will see a result very soon, we can double the dose but no sooner than 1 week from teh start. If yyou feel like its helping but not completely call me and I can send a new prescription  You should have another pap in 3 years if this one is fine.   Try to cut back on the amount of advil and naproxyn you are using

## 2014-09-13 NOTE — Assessment & Plan Note (Signed)
Improved with 1-2 doses of Ambien per week Continue Ambien current dose Also discussed possibility of amitriptyline or Benadryl

## 2014-09-13 NOTE — Assessment & Plan Note (Signed)
Symptoms and wet prep consistent with BV Treatment metronidazole orally, patient also requests some gel to keep on hand as she has recurrent symptoms-this seems reasonable at this time.
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
## 2014-09-13 NOTE — Assessment & Plan Note (Signed)
Phantom limb pain, previously failed Effexor and gabapentin for pain relief Controlled with high-dose NSAIDs now Trial of Lyrica Discussed decreasing her NSAID use
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
## 2014-09-16 LAB — CYTOLOGY - PAP

## 2014-09-16 LAB — CERVICOVAGINAL ANCILLARY ONLY
CHLAMYDIA, DNA PROBE: NEGATIVE
NEISSERIA GONORRHEA: NEGATIVE

## 2014-09-18 ENCOUNTER — Telehealth: Payer: Self-pay | Admitting: Family Medicine

## 2014-09-18 ENCOUNTER — Encounter: Payer: Self-pay | Admitting: Family Medicine

## 2014-09-18 DIAGNOSIS — B9689 Other specified bacterial agents as the cause of diseases classified elsewhere: Secondary | ICD-10-CM

## 2014-09-18 DIAGNOSIS — N760 Acute vaginitis: Principal | ICD-10-CM

## 2014-09-18 MED ORDER — METRONIDAZOLE 500 MG PO TABS: 500.0000 mg | ORAL_TABLET | Freq: Two times a day (BID) | ORAL | Status: DC

## 2014-09-18 NOTE — Telephone Encounter (Signed)
Metro gel is not covered under her insurance Please change it to the pill

## 2014-09-18 NOTE — Telephone Encounter (Signed)
Patient informed. 

## 2014-12-03 ENCOUNTER — Other Ambulatory Visit: Payer: Self-pay | Admitting: *Deleted

## 2014-12-03 MED ORDER — ETONOGESTREL-ETHINYL ESTRADIOL 0.12-0.015 MG/24HR VA RING: VAGINAL_RING | VAGINAL | Status: DC

## 2015-04-30 ENCOUNTER — Other Ambulatory Visit: Payer: Self-pay | Admitting: *Deleted

## 2015-04-30 DIAGNOSIS — M79609 Pain in unspecified limb: Secondary | ICD-10-CM

## 2015-04-30 DIAGNOSIS — T8789 Other complications of amputation stump: Principal | ICD-10-CM

## 2015-05-01 MED ORDER — IBUPROFEN 600 MG PO TABS: 600.0000 mg | ORAL_TABLET | Freq: Four times a day (QID) | ORAL | Status: DC | PRN
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
## 2015-07-01 ENCOUNTER — Ambulatory Visit (INDEPENDENT_AMBULATORY_CARE_PROVIDER_SITE_OTHER): Payer: Medicaid Other | Admitting: Family Medicine

## 2015-07-01 ENCOUNTER — Other Ambulatory Visit (HOSPITAL_COMMUNITY)
Admission: RE | Admit: 2015-07-01 | Discharge: 2015-07-01 | Disposition: A | Discharge status: Home / Self Care | Payer: No Typology Code available for payment source | Source: Ambulatory Visit | Attending: Family Medicine | Admitting: Family Medicine

## 2015-07-01 ENCOUNTER — Encounter: Payer: Self-pay | Admitting: Family Medicine

## 2015-07-01 VITALS — BP 119/70 | HR 76 | Temp 98.9°F | Ht 64.0 in | Wt 165.8 lb

## 2015-07-01 DIAGNOSIS — Z113 Encounter for screening for infections with a predominantly sexual mode of transmission: Secondary | ICD-10-CM | POA: Insufficient documentation

## 2015-07-01 LAB — POCT WET PREP (WET MOUNT): Clue Cells Wet Prep Whiff POC: NEGATIVE

## 2015-07-01 LAB — POCT URINE PREGNANCY: PREG TEST UR: NEGATIVE

## 2015-07-01 NOTE — Progress Notes (Signed)
   Subjective:    Patient ID: Jillian GibbonsJennifer Hernandez, female    DOB: 11-20-1980, 35 y.o.   MRN: [id number]  Seen for Same day visit for   CC: STD screening  She reports she always gets yearly STD screening.  Has been sexually active with one partner without condom use.  Uses NuvaRing for birth control.  LMP 05/20/15.  Denies symptoms of sexual transmitted diseases: No fevers, chills, vaginal lesions, vaginal pain / itching / burning / discharge.  Reports remote history of gonorrhea, status post treatment.   Smoking history noted  Review of Systems   See HPI for ROS. Objective:  BP 119/70 mmHg  Pulse 76  Temp(Src) 98.9 F (37.2 C) (Oral)  Ht 5\' 4"  (1.626 m)  Wt 165 lb 12.8 oz (75.206 kg)  BMI 28.45 kg/m2  General: NAD Cardiac: RRR, normal heart sounds, no murmurs. 2+ radial and PT pulses bilaterally Respiratory: CTAB, normal effort Speculum Exam: Ext genitalia: wnl; Vaginal discharge: thin; Cervix: wnl Bimanual Exam: No Cervical motion tenderness; No Vaginal wall defects; Adnexa nontender    Assessment & Plan:   1. Screening for STD (sexually transmitted disease) - RPR - GC/Chlamydia probe amp ([place] - POCT urine pregnancy - POCT Wet Prep [facility]([place]) - HIV antibody

## 2015-07-02 LAB — GC/CHLAMYDIA PROBE AMP (~~LOC~~) NOT AT ARMC
CHLAMYDIA, DNA PROBE: NEGATIVE
Neisseria Gonorrhea: NEGATIVE

## 2015-07-02 LAB — RPR

## 2015-07-02 LAB — HIV ANTIBODY (ROUTINE TESTING W REFLEX): HIV: NONREACTIVE

## 2015-12-27 ENCOUNTER — Other Ambulatory Visit: Payer: Self-pay | Admitting: Family Medicine

## 2016-01-28 ENCOUNTER — Telehealth: Payer: Self-pay | Admitting: *Deleted

## 2016-01-28 ENCOUNTER — Encounter (HOSPITAL_COMMUNITY): Payer: Self-pay

## 2016-01-28 ENCOUNTER — Emergency Department (HOSPITAL_COMMUNITY)
Admission: EM | Admit: 2016-01-28 | Discharge: 2016-01-28 | Disposition: A | Discharge status: Home / Self Care | Payer: Medicaid Other | Attending: Emergency Medicine | Admitting: Emergency Medicine

## 2016-01-28 DIAGNOSIS — Y929 Unspecified place or not applicable: Secondary | ICD-10-CM | POA: Insufficient documentation

## 2016-01-28 DIAGNOSIS — T2121XA Burn of second degree of chest wall, initial encounter: Secondary | ICD-10-CM | POA: Insufficient documentation

## 2016-01-28 DIAGNOSIS — Y999 Unspecified external cause status: Secondary | ICD-10-CM | POA: Insufficient documentation

## 2016-01-28 DIAGNOSIS — Y939 Activity, unspecified: Secondary | ICD-10-CM | POA: Insufficient documentation

## 2016-01-28 DIAGNOSIS — T2111XA Burn of first degree of chest wall, initial encounter: Secondary | ICD-10-CM

## 2016-01-28 DIAGNOSIS — X12XXXA Contact with other hot fluids, initial encounter: Secondary | ICD-10-CM | POA: Insufficient documentation

## 2016-01-28 MED ORDER — MUPIROCIN CALCIUM 2 % EX CREA: 1.0000 "application " | TOPICAL_CREAM | Freq: Two times a day (BID) | CUTANEOUS | 0 refills | Status: DC

## 2016-01-28 NOTE — Telephone Encounter (Signed)
Pharmacy called related to Rx: mupirocin cream (BACTROBAN) 2 % .Marland Kitchen.Marland Kitchen.EDCM clarified with EDP to change Rx to: ointment and pt insurance only pays for ointment.
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
## 2016-01-28 NOTE — ED Notes (Signed)
Pt is in stable condition upon d/c and ambulates from ED. 

## 2016-01-29 ENCOUNTER — Encounter: Payer: Self-pay | Admitting: Family Medicine

## 2016-01-29 ENCOUNTER — Ambulatory Visit (INDEPENDENT_AMBULATORY_CARE_PROVIDER_SITE_OTHER): Payer: Self-pay | Admitting: Family Medicine

## 2016-01-29 DIAGNOSIS — T300 Burn of unspecified body region, unspecified degree: Secondary | ICD-10-CM

## 2016-01-29 NOTE — Patient Instructions (Signed)
Burn Care °Your skin is a natural barrier to infection. It is the largest organ of your body. Burns damage this natural protection. To help prevent infection, it is very important to follow your caregiver's instructions in the care of your burn. °Burns are classified as: °· First degree. There is only redness of the skin (erythema). No scarring is expected. °· Second degree. There is blistering of the skin. Scarring may occur with deeper burns. °· Third degree. All layers of the skin are injured, and scarring is expected. °HOME CARE INSTRUCTIONS  °· Wash your hands well before changing your bandage. °· Change your bandage as often as directed by your caregiver. °¨ Remove the old bandage. If the bandage sticks, you may soak it off with cool, clean water. °¨ Cleanse the burn thoroughly but gently with mild soap and water. °¨ Pat the area dry with a clean, dry cloth. °¨ Apply a thin layer of antibacterial cream to the burn. °¨ Apply a clean bandage as instructed by your caregiver. °¨ Keep the bandage as clean and dry as possible. °· Elevate the affected area for the first 24 hours, then as instructed by your caregiver. °· Only take over-the-counter or prescription medicines for pain, discomfort, or fever as directed by your caregiver. °SEEK IMMEDIATE MEDICAL CARE IF:  °· You develop excessive pain. °· You develop redness, tenderness, swelling, or red streaks near the burn. °· The burned area develops yellowish-white fluid (pus) or a bad smell. °· You have a fever. °MAKE SURE YOU:  °· Understand these instructions. °· Will watch your condition. °· Will get help right away if you are not doing well or get worse. °  °This information is not intended to replace advice given to you by your health care provider. Make sure you discuss any questions you have with your health care provider. °  °Document Released: 04/12/2005 Document Revised: 07/05/2011 Document Reviewed: 09/02/2010 °Elsevier Interactive Patient Education ©2016  [employer] ° °
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
## 2016-01-29 NOTE — Assessment & Plan Note (Signed)
Advised patient to apply mupirocin ointment twice daily After split area closes, she can discontinue mupirocin and start scar cream Return precautions including spreading erythema, purulent drainage, fevers discussed with patient

## 2017-01-03 ENCOUNTER — Other Ambulatory Visit: Payer: Self-pay | Admitting: Family Medicine

## 2017-01-11 ENCOUNTER — Other Ambulatory Visit: Payer: Self-pay | Admitting: *Deleted

## 2017-01-11 MED ORDER — ETONOGESTREL-ETHINYL ESTRADIOL 0.12-0.015 MG/24HR VA RING: 1.0000 | VAGINAL_RING | VAGINAL | 0 refills | Status: DC

## 2017-01-11 NOTE — Telephone Encounter (Signed)
Refill requested for nuva ring birth control. Patient hasn't been seen in 1 year. She will need to schedule an appointment for a check up and for future refills. Will send in 30 day supply for now.   [facility] team please call patient to schedule annual physical. Thank you.

## 2017-01-11 NOTE — Telephone Encounter (Signed)
Tried to contact pt. Phone rang with no option to leave a message. If pt calls, please schedule an appt for pt to be see for future refills. Sunday Spillers, CMA

## 2017-01-18 NOTE — Telephone Encounter (Signed)
Pt informed and appt made. Nyhla Mountjoy Dawn, CMA  
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
## 2017-01-26 ENCOUNTER — Encounter: Payer: Self-pay | Admitting: Family Medicine

## 2017-01-26 ENCOUNTER — Ambulatory Visit (INDEPENDENT_AMBULATORY_CARE_PROVIDER_SITE_OTHER): Payer: Medicaid Other | Admitting: Family Medicine

## 2017-01-26 DIAGNOSIS — M79609 Pain in unspecified limb: Secondary | ICD-10-CM

## 2017-01-26 DIAGNOSIS — Z309 Encounter for contraceptive management, unspecified: Secondary | ICD-10-CM

## 2017-01-26 DIAGNOSIS — R635 Abnormal weight gain: Secondary | ICD-10-CM

## 2017-01-26 DIAGNOSIS — Z0000 Encounter for general adult medical examination without abnormal findings: Secondary | ICD-10-CM

## 2017-01-26 DIAGNOSIS — T8789 Other complications of amputation stump: Principal | ICD-10-CM

## 2017-01-26 MED ORDER — ETONOGESTREL-ETHINYL ESTRADIOL 0.12-0.015 MG/24HR VA RING: 1.0000 | VAGINAL_RING | VAGINAL | 12 refills | Status: DC

## 2017-01-26 MED ORDER — PREGABALIN 75 MG PO CAPS: 75.0000 mg | ORAL_CAPSULE | Freq: Two times a day (BID) | ORAL | 3 refills | Status: DC

## 2017-01-26 NOTE — Patient Instructions (Addendum)
Thank you for coming in today, it was so nice to see you! Today we talked about:    Birth control: We have refilled your nuva ring   Weight: I think that you would greatly benefit from seeing a nutritionist.  Please call Dr Gerilyn Pilgrim at [phone] to schedule an appointment  Please follow up as needed  If you have any questions or concerns, please do not hesitate to call the office at [phone]. You can also message me directly via MyChart.   Sincerely,  Anders Simmonds, MD

## 2017-01-26 NOTE — Assessment & Plan Note (Signed)
Patient has gained 30 pounds in the last year. Patient denies significant change in her diet since last year. Suspect that patient has room for improvement in exercise and diet habits. She is interested in speaking with our nutritionist Dr. Gerilyn Pilgrim. Referral for nutrition placed in patient given number to call. Can consider checking TSH in the future.

## 2017-01-26 NOTE — Assessment & Plan Note (Signed)
Patient interested in continuing the NuvaRing. Refills sent to her pharmacy today

## 2017-01-26 NOTE — Assessment & Plan Note (Signed)
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 Previously failed Effexor and gabapentin. Has been taking intermittent ibuprofen which provides mild relief. She has previously tried Lyrica however she notes that she'll like to get one today and set up twice and it didn't work very well for her. -Trial of Lyrica 75 mg twice a day -Follow-up in one month if no improvement

## 2017-01-28 ENCOUNTER — Telehealth: Payer: Self-pay | Admitting: *Deleted

## 2017-01-28 NOTE — Telephone Encounter (Signed)
Patient left message on nurse line stating that insurance will not cover lyrica. Would like something that medicaid will cover.

## 2017-01-28 NOTE — Telephone Encounter (Signed)
Called patient back. No answer. Left voicemail. Will be happy to fill out a prior auth for lyrica if able.   Anders Simmonds, MD [facility], PGY-3

## 2017-03-03 ENCOUNTER — Ambulatory Visit: Payer: Self-pay | Admitting: Dietician

## 2017-03-13 NOTE — Progress Notes (Signed)
Subjective:    Patient ID: Jillian GibbonsJennifer Hernandez , female   DOB: Jun 01, 1980 , 36 y.o..   MRN: [id number]  HPI  Jillian GibbonsJennifer Hernandez is here for  Chief Complaint  Patient presents with  . STD check    1. VAGINAL DISCHARGE  Having vaginal discharge for 3 days. Medications tried: none Discharge consistency: milky Discharge color: clear to gray  Recent antibiotic use: none Sex in last month: no Possible STD exposure:no  Symptoms Fever: no Dysuria:no Vaginal bleeding: no Abdomen or Pelvic pain: no Back pain: no Genital sores or ulcers:no Rash: no Pain during sex: no Missed menstrual period: no  ROS see HPI Smoking Status noted  Review of Systems: Per HPI.  There are no preventive care reminders to display for this patient.  Past Medical History: Patient Active Problem List   Diagnosis Date Noted  . Weight gain 01/26/2017  . Bacterial vaginosis 09/13/2014  . Hand numbness 07/13/2013  . Stump pain (HCC) 03/21/2013  . Insomnia 01/18/2013  . Adjustment disorder with mixed anxiety and depressed mood 11/28/2012  . Hematochezia 11/28/2012  . 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 Contraception management 06/21/2011    Medications: reviewed and updated Current Outpatient Medications  Medication Sig Dispense Refill  . etonogestrel-ethinyl estradiol (NUVARING) 0.12-0.015 MG/24HR vaginal ring Place 1 each vaginally every 28 (twenty-eight) days. Insert vaginally and leave in place for 3 consecutive weeks, then remove for 1 week. 1 each 12  . ibuprofen (ADVIL,MOTRIN) 600 MG tablet Take 1 tablet (600 mg total) by mouth every 6 (six) hours as needed. 30 tablet 0  . metroNIDAZOLE (FLAGYL) 500 MG tablet Take 1 tablet (500 mg total) 2 (two) times daily by mouth. 14 tablet 0  . pregabalin (LYRICA) 75 MG capsule Take 1 capsule (75 mg total) by mouth 2 (two) times daily. (Patient not taking: Reported on 01/26/2017) 60 capsule 3  . zolpidem (AMBIEN) 5 MG tablet  Take 1 tablet (5 mg total) by mouth at bedtime as needed for sleep. (Patient not taking: Reported on 01/26/2017) 30 tablet 1   No current facility-administered medications for this visit.     Social Hx:  reports that  has never smoked. she has never used smokeless tobacco.   Objective:   BP 108/78   Pulse 65   Temp 98.4 F (36.9 C) (Oral)   Ht 5\' 4"  (1.626 m)   Wt 202 lb 12.8 oz (92 kg)   LMP 02/20/2017 (Exact Date)   SpO2 99%   BMI 34.81 kg/m  Physical Exam  Gen: NAD, alert, cooperative with exam, well-appearing Psych: good insight, normal mood and affect GYN:  External genitalia within normal limits.  Vaginal mucosa pink, moist, normal rugae.  Nonfriable cervix without lesions, clear mucousy discharge noted on speculum exam, no blood noted. Bimanual exam revealed normal, nongravid uterus. Palpable nuva ring. No cervical motion tenderness. No adnexal masses bilaterally.    Results for orders placed or performed in visit on 03/14/17  POCT Wet Prep [facility] [place])  Result Value Ref Range   Source Wet Prep POC VAG    WBC, Wet Prep HPF POC 10-20    Bacteria Wet Prep HPF POC Many (A) Few   Clue Cells Wet Prep HPF POC Many (A) None   Clue Cells Wet Prep Whiff POC Positive Whiff    Yeast Wet Prep HPF POC None    Trichomonas Wet Prep HPF POC Absent Absent    Assessment & Plan:  Bacterial vaginosis Exam  and lab findings consistent with bacterial vaginosis.  Patient states that she has had these in the past but none in the last year. -Metronidazole 500 mg twice daily x 7-day course - Discussed that this is definitely not an STD -GC and Chlamydia also collected today, will call patient if those results are positive and treat appropriately -Follow-up precautions discussed  Orders Placed This Encounter  Procedures  . POCT Wet Prep [facility] [place])    Anders Simmondshristina Khaiden Segreto, MD [facility], PGY-3

## 2017-03-14 ENCOUNTER — Other Ambulatory Visit: Payer: Self-pay

## 2017-03-14 ENCOUNTER — Ambulatory Visit (INDEPENDENT_AMBULATORY_CARE_PROVIDER_SITE_OTHER): Payer: Self-pay | Admitting: Family Medicine

## 2017-03-14 ENCOUNTER — Other Ambulatory Visit (HOSPITAL_COMMUNITY)
Admission: RE | Admit: 2017-03-14 | Discharge: 2017-03-14 | Disposition: A | Discharge status: Home / Self Care | Payer: Medicaid Other | Source: Ambulatory Visit | Attending: Family Medicine | Admitting: Family Medicine

## 2017-03-14 VITALS — BP 108/78 | HR 65 | Temp 98.4°F | Ht 64.0 in | Wt 202.8 lb

## 2017-03-14 DIAGNOSIS — N898 Other specified noninflammatory disorders of vagina: Secondary | ICD-10-CM | POA: Diagnosis present

## 2017-03-14 DIAGNOSIS — B9689 Other specified bacterial agents as the cause of diseases classified elsewhere: Secondary | ICD-10-CM

## 2017-03-14 DIAGNOSIS — N760 Acute vaginitis: Secondary | ICD-10-CM

## 2017-03-14 LAB — POCT WET PREP (WET MOUNT)
Clue Cells Wet Prep Whiff POC: POSITIVE
Trichomonas Wet Prep HPF POC: ABSENT

## 2017-03-14 MED ORDER — METRONIDAZOLE 500 MG PO TABS: 500.0000 mg | ORAL_TABLET | Freq: Two times a day (BID) | ORAL | 0 refills | Status: DC

## 2017-03-14 NOTE — Patient Instructions (Signed)

## 2017-03-15 LAB — CERVICOVAGINAL ANCILLARY ONLY
CHLAMYDIA, DNA PROBE: NEGATIVE
NEISSERIA GONORRHEA: NEGATIVE

## 2017-03-15 NOTE — Assessment & Plan Note (Signed)
Exam and lab findings consistent with bacterial vaginosis.  Patient states that she has had these in the past but none in the last year. -Metronidazole 500 mg twice daily x 7-day course - Discussed that this is definitely not an STD -GC and Chlamydia also collected today, will call patient if those results are positive and treat appropriately -Follow-up precautions discussed

## 2017-03-16 ENCOUNTER — Encounter: Payer: Self-pay | Admitting: Family Medicine

## 2017-03-22 ENCOUNTER — Encounter: Payer: Self-pay | Admitting: Family Medicine

## 2018-04-18 ENCOUNTER — Other Ambulatory Visit: Payer: Self-pay

## 2018-04-18 ENCOUNTER — Emergency Department (HOSPITAL_COMMUNITY)
Admission: EM | Admit: 2018-04-18 | Discharge: 2018-04-19 | Disposition: A | Discharge status: Home / Self Care | Payer: Medicaid Other | Attending: Emergency Medicine | Admitting: Emergency Medicine

## 2018-04-18 ENCOUNTER — Encounter (HOSPITAL_COMMUNITY): Payer: Self-pay | Admitting: *Deleted

## 2018-04-18 DIAGNOSIS — L03115 Cellulitis of right lower limb: Secondary | ICD-10-CM

## 2018-04-18 DIAGNOSIS — Y999 Unspecified external cause status: Secondary | ICD-10-CM | POA: Insufficient documentation

## 2018-04-18 DIAGNOSIS — W57XXXA Bitten or stung by nonvenomous insect and other nonvenomous arthropods, initial encounter: Secondary | ICD-10-CM | POA: Insufficient documentation

## 2018-04-18 DIAGNOSIS — Y929 Unspecified place or not applicable: Secondary | ICD-10-CM | POA: Insufficient documentation

## 2018-04-18 DIAGNOSIS — S80861A Insect bite (nonvenomous), right lower leg, initial encounter: Secondary | ICD-10-CM | POA: Insufficient documentation

## 2018-04-18 DIAGNOSIS — Y939 Activity, unspecified: Secondary | ICD-10-CM | POA: Insufficient documentation

## 2018-04-18 DIAGNOSIS — Z79899 Other long term (current) drug therapy: Secondary | ICD-10-CM | POA: Insufficient documentation

## 2018-04-18 DIAGNOSIS — Z23 Encounter for immunization: Secondary | ICD-10-CM | POA: Insufficient documentation

## 2018-04-18 MED ORDER — DOXYCYCLINE HYCLATE 100 MG PO TABS
100.0000 mg | ORAL_TABLET | Freq: Once | ORAL | Status: AC
  Administered 2018-04-19: 100 mg via ORAL
  Filled 2018-04-18: qty 1

## 2018-04-18 MED ORDER — TETANUS-DIPHTH-ACELL PERTUSSIS 5-2.5-18.5 LF-MCG/0.5 IM SUSP
0.5000 mL | Freq: Once | INTRAMUSCULAR | Status: AC
  Administered 2018-04-19: 0.5 mL via INTRAMUSCULAR
  Filled 2018-04-18: qty 0.5

## 2018-04-18 NOTE — ED Triage Notes (Signed)
Pt stated "I felt something bite me on Sunday."  Pt presents with erythema & edema to RLE.
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
## 2018-04-19 MED ORDER — DOXYCYCLINE HYCLATE 100 MG PO CAPS: 100.0000 mg | ORAL_CAPSULE | Freq: Two times a day (BID) | ORAL | 0 refills | Status: DC

## 2018-04-19 MED ORDER — IBUPROFEN 600 MG PO TABS: 600.0000 mg | ORAL_TABLET | Freq: Four times a day (QID) | ORAL | 0 refills | Status: DC | PRN

## 2018-04-19 NOTE — Discharge Instructions (Addendum)
Take the antibiotics as prescribed, use warm compresses and keep the leg elevated.  Return for a wound check in 2 days.  Return sooner with spreading redness, fever, chills, nausea, vomiting or other concerns.

## 2018-06-04 ENCOUNTER — Other Ambulatory Visit: Payer: Self-pay | Admitting: Family Medicine

## 2018-06-05 NOTE — Telephone Encounter (Signed)
Patient has not been seen in >1 year (last appointment in 2018). In order to give refill for birth control patient will need yearly exam.   Please have patient come in and be seen for refills.   I have sent in a 1 month refill so she has that while she schedules appointment.

## 2018-10-14 ENCOUNTER — Other Ambulatory Visit: Payer: Self-pay | Admitting: Family Medicine

## 2018-10-16 NOTE — Telephone Encounter (Signed)
Please schedule patient for follow up for contraception in order to have further refills  Jillian More, DO, PGY-2 [facility] 10/16/2018 11:35 AM

## 2018-12-30 ENCOUNTER — Other Ambulatory Visit: Payer: Self-pay | Admitting: Family Medicine

## 2022-01-19 ENCOUNTER — Telehealth: Payer: Self-pay | Admitting: Physician Assistant

## 2022-01-19 DIAGNOSIS — M546 Pain in thoracic spine: Secondary | ICD-10-CM

## 2022-01-19 MED ORDER — MELOXICAM 15 MG PO TABS: 15.0000 mg | ORAL_TABLET | Freq: Every day | ORAL | 0 refills | Status: AC

## 2022-01-19 MED ORDER — CYCLOBENZAPRINE HCL 10 MG PO TABS: 10.0000 mg | ORAL_TABLET | Freq: Three times a day (TID) | ORAL | 0 refills | Status: DC | PRN

## 2022-01-19 NOTE — Patient Instructions (Signed)
Jillian Hernandez, thank you for joining [name], PA-C for today's virtual visit.  While this provider is not your primary care provider (PCP), if your PCP is located in our provider database this encounter information will be shared with them immediately following your visit.  Consent: (Patient) Jillian Hernandez provided verbal consent for this virtual visit at the beginning of the encounter.  Current Medications:  Current Outpatient Medications:    doxycycline (VIBRAMYCIN) 100 MG capsule, Take 1 capsule (100 mg total) by mouth 2 (two) times daily., Disp: 20 capsule, Rfl: 0   ELURYNG 0.12-0.015 MG/24HR vaginal ring, INSERT 1 RING VAGINALLY FOR 3 WEEKS REMOVE FOR 1 WEEK AND REPEAT, Disp: 1 each, Rfl: 0   ibuprofen (ADVIL,MOTRIN) 600 MG tablet, Take 1 tablet (600 mg total) by mouth every 6 (six) hours as needed., Disp: 30 tablet, Rfl: 0   metroNIDAZOLE (FLAGYL) 500 MG tablet, Take 1 tablet (500 mg total) 2 (two) times daily by mouth., Disp: 14 tablet, Rfl: 0   pregabalin (LYRICA) 75 MG capsule, Take 1 capsule (75 mg total) by mouth 2 (two) times daily. (Patient not taking: Reported on 01/26/2017), Disp: 60 capsule, Rfl: 3   zolpidem (AMBIEN) 5 MG tablet, Take 1 tablet (5 mg total) by mouth at bedtime as needed for sleep. (Patient not taking: Reported on 01/26/2017), Disp: 30 tablet, Rfl: 1   Medications ordered in this encounter:  No orders of the defined types were placed in this encounter.    *If you need refills on other medications prior to your next appointment, please contact your pharmacy*  Follow-Up: Call back or seek an in-person evaluation if the symptoms worsen or if the condition fails to improve as anticipated.  [facility] [phone]  Other Instructions Please avoid heavy lifting, pushing or pulling. Rest. Apply heating pad to the area of concern for 15 minutes, a few times per day. Take the meloxicam once daily with food. Tylenol if  needed. Use the flexeril as directed, when resting at home.   If not resolving or any new/worsening symptoms, you need to seek an in-person evaluation.   Thoracic Strain A thoracic strain, which is sometimes called a mid-back strain, is an injury to the muscles or tendons that attach to the upper part of your back behind your chest. This type of injury occurs when a muscle is overstretched or overloaded. Thoracic strains can range from mild to severe. Mild strains may involve stretching a muscle or tendon without tearing it. These injuries may heal in 1-2 weeks. More severe strains involve tearing of muscle fibers or tendons. These will cause more pain and may take 6-8 weeks to heal. What are the causes? This condition may be caused by: Trauma, such as a fall or a hit to the body. Twisting or overstretching the back. This may result from doing activities that require a lot of energy, such as lifting heavy objects. In some cases, the cause may not be known. What increases the risk? This injury is more common in: Athletes. People with obesity. What are the signs or symptoms? The main symptom of this condition is pain in the middle back, especially with movement. Other symptoms include: Stiffness or limited range of motion. Sudden muscle tightening (spasms). How is this diagnosed? This condition may be diagnosed based on: Your symptoms. Your medical history. A physical exam. Imaging tests, such as X-rays or an MRI. How is this treated? This condition may be treated with: Resting the injured area. Applying  heat and cold to the injured area. Over-the-counter medicines for pain and inflammation, such as NSAIDs. Prescription pain medicine or muscle relaxants may be needed for a short time. Physical therapy. This will involve doing stretching and strengthening exercises. Follow these instructions at home: Managing pain, stiffness, and swelling     If directed, put ice on the injured  area. Put ice in a plastic bag. Place a towel between your skin and the bag. Leave the ice on for 20 minutes, 2-3 times a day. If directed, apply heat to the affected area as often as told by your health care provider. Use the heat source that your health care provider recommends, such as a moist heat pack or a heating pad. Place a towel between your skin and the heat source. Leave the heat on for 20-30 minutes. Remove the heat if your skin turns bright red. This is especially important if you are unable to feel pain, heat, or cold. You may have a greater risk of getting burned. Activity Rest and return to your normal activities as told by your health care provider. Ask your health care provider what activities are safe for you. Do exercises as told by your health care provider. Medicines Take over-the-counter and prescription medicines only as told by your health care provider. Ask your health care provider if the medicine prescribed to you: Requires you to avoid driving or using heavy machinery. Can cause constipation. You may need to take these actions to prevent or treat constipation: Drink enough fluid to keep your urine pale yellow. Take over-the-counter or prescription medicines. Eat foods that are high in fiber, such as beans, whole grains, and fresh fruits and vegetables. Limit foods that are high in fat and processed sugars, such as fried or sweet foods. Injury prevention To prevent a future mid-back injury: Always warm up properly before physical activity or sports. Cool down and stretch after being active. Use correct form when playing sports and lifting heavy objects. Bend your knees before you lift heavy objects. Use good posture when sitting and standing. Stay physically fit and maintain a healthy weight. Do at least 150 minutes of moderate-intensity exercise each week, such as brisk walking or water aerobics. Do strength exercises at least 2 times each week.  General  instructions Do not use any products that contain nicotine or tobacco, such as cigarettes, e-cigarettes, and chewing tobacco. If you need help quitting, ask your health care provider. Keep all follow-up visits as told by your health care provider. This is important. Contact a health care provider if: Your pain is not helped by medicine. Your pain or stiffness is getting worse. You develop pain or stiffness in your neck or lower back. Get help right away if you: Have shortness of breath. Have chest pain. Develop numbness or weakness in your legs or arms. Have involuntary loss of urine (urinary incontinence). Summary A thoracic strain, which is sometimes called a mid-back strain, is an injury to the muscles or tendons that attach to the upper part of your back behind your chest. This type of injury occurs when a muscle is overstretched or overloaded. Rest and return to your normal activities as told by your health care provider. If directed, apply heat or ice to the affected area as often as told by your health care provider. Take over-the-counter and prescription medicines only as told by your health care provider. Contact a health care provider if you have new or worsening symptoms. This information is not intended to  replace advice given to you by your health care provider. Make sure you discuss any questions you have with your health care provider. Document Revised: 02/11/2021 Document Reviewed: 02/11/2021 Elsevier Patient Education  [place].    If you have been instructed to have an in-person evaluation today at a local Urgent Care facility, please use the link below. It will take you to a list of all of our available [facility], including address, phone number and hours of operation. Please do not delay care.  [facility]  If you or a family member do not have a primary care provider, use the link below to schedule a visit and establish care. When  you choose a [facility] primary care physician or advanced practice provider, you gain a long-term partner in health. Find a Primary Care Provider  Learn more about [facility] in-office and virtual care options: [facility]

## 2022-01-19 NOTE — Progress Notes (Signed)
Virtual Visit Consent   Jillian Hernandez, you are scheduled for a virtual visit with a [facility] provider today. Just as with appointments in the office, your consent must be obtained to participate. Your consent will be active for this visit and any virtual visit you may have with one of our providers in the next 365 days. If you have a MyChart account, a copy of this consent can be sent to you electronically.  As this is a virtual visit, video technology does not allow for your provider to perform a traditional examination. This may limit your provider's ability to fully assess your condition. If your provider identifies any concerns that need to be evaluated in person or the need to arrange testing (such as labs, EKG, etc.), we will make arrangements to do so. Although advances in technology are sophisticated, we cannot ensure that it will always work on either your end or our end. If the connection with a video visit is poor, the visit may have to be switched to a telephone visit. With either a video or telephone visit, we are not always able to ensure that we have a secure connection.  By engaging in this virtual visit, you consent to the provision of healthcare and authorize for your insurance to be billed (if applicable) for the services provided during this visit. Depending on your insurance coverage, you may receive a charge related to this service.  I need to obtain your verbal consent now. Are you willing to proceed with your visit today? [name] has provided verbal consent on 01/19/2022 for a virtual visit (video or telephone). Jillian Hernandez, [place]  Date: 01/19/2022 1:39 PM  Virtual Visit via Video Note   I, Jillian Hernandez, connected with  Jillian Hernandez  ([id number], [birth year]) on 01/19/22 at  1:30 PM EDT by a video-enabled telemedicine application and verified that I am speaking with the correct person using two identifiers.  Location: Patient: Virtual Visit  Location Patient: Home Provider: Virtual Visit Location Provider: Home Office   I discussed the limitations of evaluation and management by telemedicine and the availability of in person appointments. The patient expressed understanding and agreed to proceed.    History of Present Illness: Jillian Hernandez is a 41 y.o. who identifies as a female who was assigned female at birth, and is being seen today for 2-3 weeks of left sided thoracic back pain, worse with standing for prolonged periods of time and with repetitive upper arm movements at work. Denies trauma or injury. Pain is typically intermittent but the longer she is working, becomes more constant. Sometimes affects lower back. Has noted associated tightness in her back and side on occasion. No LE involvement. Is s/p L arm amputation.  TENS machine -- helps to subside the pain. OTC Meds: Tylenol OTC.   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   Contraception management 06/21/2011    Allergies:  Allergies  Allergen Reactions   Chicken Allergy Hives   Medications:  Current Outpatient Medications:    cyclobenzaprine (FLEXERIL) 10 MG tablet, Take 1 tablet (10 mg total) by mouth 3 (three) times daily as needed for muscle spasms., Disp:  30 tablet, Rfl: 0   meloxicam (MOBIC) 15 MG tablet, Take 1 tablet (15 mg total) by mouth daily., Disp: 30 tablet, Rfl: 0   ELURYNG 0.12-0.015 MG/24HR vaginal ring, INSERT 1 RING VAGINALLY FOR 3 WEEKS REMOVE FOR 1 WEEK AND REPEAT, Disp: 1 each, Rfl: 0  Observations/Objective: Patient is well-developed, well-nourished in no acute distress.  Resting comfortably at home.  Head  is normocephalic, atraumatic.  No labored breathing. Speech is clear and coherent with logical content.  Patient is alert and oriented at baseline.   Assessment and Plan: 1. Acute left-sided thoracic back pain - meloxicam (MOBIC) 15 MG tablet; Take 1 tablet (15 mg total) by mouth daily.  Dispense: 30 tablet; Refill: 0 - cyclobenzaprine (FLEXERIL) 10 MG tablet; Take 1 tablet (10 mg total) by mouth 3 (three) times daily as needed for muscle spasms.  Dispense: 30 tablet; Refill: 0  Atraumatic. Suspect strain/overuse with spasm and increased tension. Rest. Heat to the area. Start Meloxicam once daily. Ok to continue Tylenol OTC. Flexeril per orders. Close in-person follow-up if not resolving. Strict ER precautions reviewed.    Follow Up Instructions: I discussed the assessment and treatment plan with the patient. The patient was provided an opportunity to ask questions and all were answered. The patient agreed with the plan and demonstrated an understanding of the instructions.  A copy of instructions were sent to the patient via MyChart unless otherwise noted below.   The patient was advised to call back or seek an in-person evaluation if the symptoms worsen or if the condition fails to improve as anticipated.  Time:  I spent 10 minutes with the patient via telehealth technology discussing the above problems/concerns.    [name], PA-C

## 2022-05-17 ENCOUNTER — Encounter: Payer: Self-pay | Admitting: Internal Medicine

## 2022-05-17 ENCOUNTER — Ambulatory Visit (INDEPENDENT_AMBULATORY_CARE_PROVIDER_SITE_OTHER): Payer: Self-pay | Admitting: Internal Medicine

## 2022-05-17 VITALS — BP 110/70 | HR 70 | Temp 98.5°F | Ht 64.0 in | Wt 212.8 lb

## 2022-05-17 DIAGNOSIS — Z124 Encounter for screening for malignant neoplasm of cervix: Secondary | ICD-10-CM

## 2022-05-17 DIAGNOSIS — M546 Pain in thoracic spine: Secondary | ICD-10-CM

## 2022-05-17 DIAGNOSIS — Z89222 Acquired absence of left upper limb above elbow: Secondary | ICD-10-CM

## 2022-05-17 DIAGNOSIS — S48112D Complete traumatic amputation at level between left shoulder and elbow, subsequent encounter: Secondary | ICD-10-CM

## 2022-05-17 DIAGNOSIS — G8929 Other chronic pain: Secondary | ICD-10-CM
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
## 2022-08-09 ENCOUNTER — Encounter: Payer: Medicaid Other | Admitting: Internal Medicine

## 2022-09-28 ENCOUNTER — Emergency Department (HOSPITAL_COMMUNITY): Payer: Medicaid Other

## 2022-09-28 ENCOUNTER — Other Ambulatory Visit: Payer: Self-pay

## 2022-09-28 ENCOUNTER — Emergency Department (HOSPITAL_COMMUNITY)
Admission: EM | Admit: 2022-09-28 | Discharge: 2022-09-28 | Disposition: A | Discharge status: Home / Self Care | Payer: Medicaid Other | Attending: Emergency Medicine | Admitting: Emergency Medicine

## 2022-09-28 DIAGNOSIS — M546 Pain in thoracic spine: Secondary | ICD-10-CM

## 2022-09-28 DIAGNOSIS — W010XXA Fall on same level from slipping, tripping and stumbling without subsequent striking against object, initial encounter: Secondary | ICD-10-CM | POA: Insufficient documentation

## 2022-09-28 DIAGNOSIS — S63501A Unspecified sprain of right wrist, initial encounter: Secondary | ICD-10-CM

## 2022-09-28 MED ORDER — CYCLOBENZAPRINE HCL 5 MG PO TABS: 5.0000 mg | ORAL_TABLET | Freq: Three times a day (TID) | ORAL | 0 refills | Status: AC | PRN

## 2022-09-28 MED ORDER — IBUPROFEN 400 MG PO TABS
800.0000 mg | ORAL_TABLET | Freq: Once | ORAL | Status: AC
  Administered 2022-09-28: 800 mg via ORAL
  Filled 2022-09-28: qty 2

## 2022-09-28 MED ORDER — IBUPROFEN 800 MG PO TABS: 800.0000 mg | ORAL_TABLET | Freq: Three times a day (TID) | ORAL | 0 refills | Status: AC

## 2022-09-28 NOTE — Discharge Instructions (Addendum)
Your x-rays did not show any fractures today.  Please use the wrist splint as needed for comfort.  You can remove it at night and when you take a shower  Take Motrin for pain and Flexeril for muscle spasms  Follow-up with orthopedic doctor in a week if you have persistent pain  Return to ER if you have uncontrolled pain or vomiting

## 2022-09-28 NOTE — ED Triage Notes (Signed)
Pt presents with R arm pain after mechanical fall just PTA at [facility]. Did not hit head or pass out. Pain most severe in wrist and hand but now going up arm as well.

## 2022-09-28 NOTE — Progress Notes (Signed)
Orthopedic Tech Progress Note Patient Details:  Jillian Hernandez 21-Jun-1980 [id number]  Ortho Devices Type of Ortho Device: Velcro wrist splint Ortho Device/Splint Location: RUE Ortho Device/Splint Interventions: Ordered, Application   Post Interventions Patient Tolerated: Well Instructions Provided: Adjustment of device, Care of device  Sherilyn Banker 09/28/2022, 3:35 PM
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
## 2022-09-28 NOTE — ED Notes (Signed)
Pt a/a, gcs 15, ambulatory w/ ease, well perfused, well appearing, no signs of distress, vss, ewob, brisk cap refill, mmm, pt acting baseline, deny questions regarding dc/ follow up care. Advised to return if s/s worsen. Wrist splint placed on R wrist, cmts intact.

## 2022-09-30 ENCOUNTER — Emergency Department (HOSPITAL_BASED_OUTPATIENT_CLINIC_OR_DEPARTMENT_OTHER): Payer: Commercial Managed Care - HMO | Admitting: Radiology

## 2022-09-30 ENCOUNTER — Other Ambulatory Visit: Payer: Self-pay

## 2022-09-30 ENCOUNTER — Encounter (HOSPITAL_BASED_OUTPATIENT_CLINIC_OR_DEPARTMENT_OTHER): Payer: Self-pay

## 2022-09-30 ENCOUNTER — Other Ambulatory Visit (HOSPITAL_BASED_OUTPATIENT_CLINIC_OR_DEPARTMENT_OTHER): Payer: Self-pay

## 2022-09-30 ENCOUNTER — Emergency Department (HOSPITAL_BASED_OUTPATIENT_CLINIC_OR_DEPARTMENT_OTHER)
Admission: EM | Admit: 2022-09-30 | Discharge: 2022-09-30 | Disposition: A | Discharge status: Home / Self Care | Payer: Commercial Managed Care - HMO | Attending: Emergency Medicine | Admitting: Emergency Medicine

## 2022-09-30 DIAGNOSIS — M546 Pain in thoracic spine: Secondary | ICD-10-CM | POA: Diagnosis not present

## 2022-09-30 DIAGNOSIS — W010XXA Fall on same level from slipping, tripping and stumbling without subsequent striking against object, initial encounter: Secondary | ICD-10-CM | POA: Insufficient documentation

## 2022-09-30 DIAGNOSIS — M25561 Pain in right knee: Secondary | ICD-10-CM | POA: Insufficient documentation

## 2022-09-30 DIAGNOSIS — S4991XA Unspecified injury of right shoulder and upper arm, initial encounter: Secondary | ICD-10-CM | POA: Diagnosis present

## 2022-09-30 DIAGNOSIS — S46911A Strain of unspecified muscle, fascia and tendon at shoulder and upper arm level, right arm, initial encounter: Secondary | ICD-10-CM | POA: Diagnosis not present

## 2022-09-30 DIAGNOSIS — W19XXXA Unspecified fall, initial encounter: Secondary | ICD-10-CM

## 2022-09-30 NOTE — ED Provider Notes (Signed)
[place] EMERGENCY DEPARTMENT AT [facility] Provider Note   CSN: [id number] Arrival date & time: 09/30/22  1009     History  Chief Complaint  Patient presents with   Jillian Hernandez is a 42 y.o. female.  HPI   42 year old female who fell in [place] on water several days ago.  On that day she was seen and evaluated in the Discussed emergency department and an x-[place] of her right wrist.  She was placed in a splint and advised to have Ortho follow-up after she left the emergency department, she began having some more pain in her right shoulder with movement of her right shoulder.  She points to the upper back and the right shoulder area.  Additionally, she has had some pain in her right knee.  Home Medications Prior to Admission medications   Medication Sig Start Date End Date Taking? Authorizing Provider  cyclobenzaprine (FLEXERIL) 5 MG tablet Take 1 tablet (5 mg total) by mouth 3 (three) times daily as needed for muscle spasms. 09/28/22   Charlynne Pander, MD  ibuprofen (ADVIL) 800 MG tablet Take 1 tablet (800 mg total) by mouth 3 (three) times daily. 09/28/22   Charlynne Pander, MD  meloxicam (MOBIC) 15 MG tablet Take 1 tablet (15 mg total) by mouth daily. 01/19/22   Waldon Merl, PA-C  norgestimate-ethinyl estradiol (ORTHO-CYCLEN,SPRINTEC,PREVIFEM) 0.25-35 MG-MCG tablet Take 1 tablet by mouth daily. 06/21/11 07/15/11  Poe, Deirdre C, CNM      Allergies    Chicken allergy    Review of Systems   Review of Systems  Physical Exam Updated Vital Signs BP 131/76 (BP Location: Right Arm)   Pulse 83   Temp 98.3 F (36.8 C) (Oral)   Resp 16   Ht 1.626 m (5\' 4" )   Wt 96.5 kg   LMP 09/23/2022 (Approximate)   SpO2 99%   BMI 36.52 kg/m  Physical Exam Vitals and nursing note reviewed.  Constitutional:      General: She is not in acute distress.    Appearance: She is well-developed.  HENT:     Head: Normocephalic and atraumatic.     Right Ear:  External ear normal.     Left Ear: External ear normal.     Nose: Nose normal.  Eyes:     Conjunctiva/sclera: Conjunctivae normal.     Pupils: Pupils are equal, round, and reactive to light.  Pulmonary:     Effort: Pulmonary effort is normal.  Musculoskeletal:        General: Tenderness present. Normal range of motion.     Cervical back: Normal range of motion and neck supple.     Comments: Some tenderness right anterior shoulder and over right scapula Mild tenderness palpation of the right knee  Skin:    General: Skin is warm and dry.     Capillary Refill: Capillary refill takes less than 2 seconds.  Neurological:     General: No focal deficit present.     Mental Status: She is alert and oriented to person, place, and time.     Motor: No abnormal muscle tone.     Coordination: Coordination normal.  Psychiatric:        Behavior: Behavior normal.        Thought Content: Thought content normal.     ED Results / Procedures / Treatments   Labs (all labs ordered are listed, but only abnormal results are displayed) Labs Reviewed - No data to  display  EKG None  Radiology DG Knee Complete 4 Views Right  Result Date: 09/30/2022 CLINICAL DATA:  Pain after a fall. EXAM: RIGHT KNEE - COMPLETE 4+ VIEW COMPARISON:  None Available. FINDINGS: No evidence of fracture, dislocation, or joint effusion. No evidence of arthropathy or other focal bone abnormality. Soft tissues are unremarkable. IMPRESSION: Negative. Electronically Signed   By: Emmaline Kluver M.D.   On: 09/30/2022 11:12   DG Shoulder Right  Result Date: 09/30/2022 CLINICAL DATA:  Pain after a fall. EXAM: RIGHT SHOULDER - 2+ VIEW COMPARISON:  None Available. FINDINGS: There is no evidence of fracture or dislocation. There is no evidence of arthropathy or other focal bone abnormality. Soft tissues are unremarkable. IMPRESSION: Negative. Electronically Signed   By: Emmaline Kluver M.D.   On: 09/30/2022 11:11   DG Wrist Complete  Right  Result Date: 09/28/2022 CLINICAL DATA:  Pain after fall EXAM: RIGHT WRIST - COMPLETE 4 VIEW COMPARISON:  None Available. FINDINGS: There is no evidence of fracture or dislocation. There is no evidence of arthropathy or other focal bone abnormality. Soft tissues are unremarkable. If there is persistent pain or further concern such as for scaphoid injury, recommend follow up imaging in 7-10 days to assess for occult abnormality. IMPRESSION: No acute osseous abnormality Electronically Signed   By: Karen Kays M.D.   On: 09/28/2022 14:15    Procedures Procedures    Medications Ordered in ED Medications - No data to display  ED Course/ Medical Decision Making/ A&P                             Medical Decision Making Amount and/or Complexity of Data Reviewed Radiology: ordered.   Patient with fall 2 days ago.  At that time she had acute pain in her wrist.  This was x-rayed and splint was placed.  Over the next period of time patient had some increased pain in her right shoulder and right knee.  X-rays obtained here today did not show any evidence of acute fracture.       Final Clinical Impression(s) / ED Diagnoses Final diagnoses:  Fall, initial encounter  Strain of right shoulder, initial encounter  Acute pain of right knee    Rx / DC Orders ED Discharge Orders     None         Margarita Grizzle, MD 09/30/22 1124

## 2022-09-30 NOTE — ED Notes (Signed)
Patient verbalizes understanding of discharge instructions. Opportunity for questioning and answers were provided. Patient discharged from ED.  °

## 2022-09-30 NOTE — ED Notes (Signed)
Patient transported to X-ray 

## 2022-09-30 NOTE — ED Triage Notes (Signed)
States fell on Tuesday slipped on water and fell.  Denies LOC  Seen at [facility] that day.  Back today with new pain to right shoulder and upper back.  Pain to right knee

## 2022-09-30 NOTE — Discharge Instructions (Signed)
X-rays obtained of your right shoulder and right knee did not show any evidence of broken bones. Please use warm and cold therapy.  You can continue to use ibuprofen and acetaminophen as needed for pain. Please return for reevaluation of worsening symptoms at any time

## 2022-10-06 ENCOUNTER — Encounter: Payer: Medicaid Other | Admitting: Internal Medicine

## 2023-01-11 ENCOUNTER — Telehealth: Payer: Self-pay | Admitting: Internal Medicine

## 2023-01-11 NOTE — Telephone Encounter (Signed)
[employer] is calling to authenticate work note. The DOS on letter is 01-10-2023 per Annice Pih

## 2023-01-11 NOTE — Telephone Encounter (Signed)
Jillian Hernandez is aware.

## 2024-04-23 ENCOUNTER — Telehealth: Payer: Self-pay
